# Patient Record
Sex: Female | Born: 1970 | Race: White | Hispanic: No | Marital: Married | Smoking: Never smoker
Health system: Southern US, Community
[De-identification: ages and names within clinical notes are randomized; demographics above are authoritative.]

## PROBLEM LIST (undated history)

## (undated) DIAGNOSIS — E119 Type 2 diabetes mellitus without complications: Secondary | ICD-10-CM

## (undated) DIAGNOSIS — R519 Headache, unspecified: Secondary | ICD-10-CM

## (undated) DIAGNOSIS — G510 Bell's palsy: Secondary | ICD-10-CM

## (undated) DIAGNOSIS — K219 Gastro-esophageal reflux disease without esophagitis: Secondary | ICD-10-CM

## (undated) DIAGNOSIS — L309 Dermatitis, unspecified: Secondary | ICD-10-CM

## (undated) DIAGNOSIS — G43909 Migraine, unspecified, not intractable, without status migrainosus: Secondary | ICD-10-CM

## (undated) HISTORY — DX: Headache, unspecified: R51.9

## (undated) HISTORY — DX: Bell's palsy: G51.0

## (undated) HISTORY — DX: Migraine, unspecified, not intractable, without status migrainosus: G43.909

## (undated) HISTORY — DX: Gastro-esophageal reflux disease without esophagitis: K21.9

## (undated) HISTORY — DX: Type 2 diabetes mellitus without complications: E11.9

## (undated) HISTORY — PX: CHOLECYSTECTOMY: SHX55

## (undated) HISTORY — DX: Dermatitis, unspecified: L30.9

## (undated) HISTORY — PX: LAPAROSCOPIC ENDOMETRIOSIS FULGURATION: SUR769

---

## 2013-09-01 ENCOUNTER — Telehealth: Payer: Self-pay | Admitting: Family Medicine

## 2013-09-01 NOTE — Telephone Encounter (Signed)
Pt is new to the area and needs a PCP Has been seen recently for a WC injury and given prednisone which caused some stomach pain Informed pt that she should follow up with the provider she saw for [facility] for this issue Pt verbalizes understanding but wanted to establish care with [place]

## 2013-09-03 ENCOUNTER — Ambulatory Visit (INDEPENDENT_AMBULATORY_CARE_PROVIDER_SITE_OTHER): Admitting: Family

## 2013-09-03 ENCOUNTER — Encounter (INDEPENDENT_AMBULATORY_CARE_PROVIDER_SITE_OTHER): Payer: Self-pay

## 2013-09-03 ENCOUNTER — Encounter: Payer: Self-pay | Admitting: Family

## 2013-09-03 VITALS — BP 108/69 | HR 64 | Temp 97.7°F | Ht 65.0 in | Wt 202.0 lb

## 2013-09-03 DIAGNOSIS — Z0000 Encounter for general adult medical examination without abnormal findings: Secondary | ICD-10-CM

## 2013-09-03 DIAGNOSIS — K2100 Gastro-esophageal reflux disease with esophagitis, without bleeding: Secondary | ICD-10-CM

## 2013-09-03 DIAGNOSIS — K219 Gastro-esophageal reflux disease without esophagitis: Secondary | ICD-10-CM | POA: Insufficient documentation

## 2013-09-03 LAB — POCT CBC
GRANULOCYTE PERCENT: 62.9 % (ref 37–80)
HCT, POC: 37.3 % — AB (ref 37.7–47.9)
HEMOGLOBIN: 11.8 g/dL — AB (ref 12.2–16.2)
Lymph, poc: 1.7 (ref 0.6–3.4)
MCH: 26.7 pg — AB (ref 27–31.2)
MCHC: 31.7 g/dL — AB (ref 31.8–35.4)
MCV: 84.2 fL (ref 80–97)
MPV: 8 fL (ref 0–99.8)
POC Granulocyte: 3.5 (ref 2–6.9)
POC LYMPH %: 31.7 % (ref 10–50)
Platelet Count, POC: 300 10*3/uL (ref 142–424)
RBC: 4.4 M/uL (ref 4.04–5.48)
RDW, POC: 14.9 %
WBC: 5.5 10*3/uL (ref 4.6–10.2)

## 2013-09-03 NOTE — Progress Notes (Signed)
   Subjective:    Patient ID: Rebecca Hernandez, female    DOB: February 19, 1970, 43 y.o.   MRN: [id number]  Gastrophageal Reflux She complains of heartburn. She reports no coughing or no nausea. This is a new problem. The current episode started 1 to 4 weeks ago. The problem occurs occasionally. The problem has been waxing and waning. The heartburn does not wake her from sleep. The symptoms are aggravated by certain foods and medications. Pertinent negatives include no muscle weakness or orthopnea. She has tried a PPI (Pt to start taking them today) for the symptoms.   *Pt states she was prescribed a steroid dose pack last week. After taking them, pt states she had a GI bleed and was put on omeprazole 40 mg. Pt states she is starting that today. She is have slight epigastric burning pain.    Review of Systems  Constitutional: Negative.   HENT: Negative.   Eyes: Negative.   Respiratory: Negative.  Negative for cough and shortness of breath.   Cardiovascular: Negative.  Negative for palpitations.  Gastrointestinal: Positive for heartburn. Negative for nausea.  Endocrine: Negative.   Genitourinary: Negative.   Musculoskeletal: Negative.  Negative for muscle weakness.  Neurological: Negative.  Negative for headaches.  Hematological: Negative.   Psychiatric/Behavioral: Negative.   All other systems reviewed and are negative.      Objective:   Physical Exam  Vitals reviewed. Constitutional: She is oriented to person, place, and time. She appears well-developed and well-nourished. No distress.  HENT:  Head: Normocephalic and atraumatic.  Right Ear: External ear normal.  Left Ear: External ear normal.  Nose: Nose normal.  Mouth/Throat: Oropharynx is clear and moist.  Eyes: Pupils are equal, round, and reactive to light.  Neck: Normal range of motion. Neck supple. No thyromegaly present.  Cardiovascular: Normal rate, regular rhythm, normal heart sounds and intact distal pulses.   No murmur  heard. Pulmonary/Chest: Effort normal and breath sounds normal. No respiratory distress. She has no wheezes.  Abdominal: Soft. Bowel sounds are normal. She exhibits no distension. There is no tenderness.  Musculoskeletal: Normal range of motion. She exhibits no edema and no tenderness.  Neurological: She is alert and oriented to person, place, and time. She has normal reflexes. No cranial nerve deficit.  Skin: Skin is warm and dry.  Psychiatric: She has a normal mood and affect. Her behavior is normal. Judgment and thought content normal.    BP 108/69  Pulse 64  Temp(Src) 97.7 F (36.5 C) (Oral)  Ht $R'5\' 5"'Gx$  (1.651 m)  Wt 202 lb (91.627 kg)  BMI 33.61 kg/m2  LMP 08/10/2013       Assessment & Plan:  1. Gastroesophageal reflux disease with esophagitis - CMP14+EGFR - POCT CBC  2. Annual physical exam - CMP14+EGFR - Lipid panel - Thyroid Panel With TSH - Vit D  25 hydroxy (rtn osteoporosis monitoring) - POCT CBC   Continue all meds Labs pending Health Maintenance reviewed-Pt to schedule mammogram and pap smear Diet and exercise encouraged RTO 1 year  Evelina Dun, FNP

## 2013-09-03 NOTE — Patient Instructions (Signed)

## 2013-09-04 LAB — CMP14+EGFR
ALK PHOS: 88 IU/L (ref 39–117)
ALT: 16 IU/L (ref 0–32)
AST: 13 IU/L (ref 0–40)
Albumin/Globulin Ratio: 1.7 (ref 1.1–2.5)
Albumin: 4.3 g/dL (ref 3.5–5.5)
BUN / CREAT RATIO: 15 (ref 9–23)
BUN: 9 mg/dL (ref 6–24)
CO2: 24 mmol/L (ref 18–29)
CREATININE: 0.6 mg/dL (ref 0.57–1.00)
Calcium: 9 mg/dL (ref 8.7–10.2)
Chloride: 101 mmol/L (ref 97–108)
GFR calc Af Amer: 130 mL/min/{1.73_m2} (ref >59)
GFR, EST NON AFRICAN AMERICAN: 113 mL/min/{1.73_m2} (ref >59)
GLOBULIN, TOTAL: 2.5 g/dL (ref 1.5–4.5)
Glucose: 112 mg/dL — ABNORMAL HIGH (ref 65–99)
Potassium: 4.4 mmol/L (ref 3.5–5.2)
SODIUM: 139 mmol/L (ref 134–144)
Total Bilirubin: 0.3 mg/dL (ref 0.0–1.2)
Total Protein: 6.8 g/dL (ref 6.0–8.5)

## 2013-09-04 LAB — THYROID PANEL WITH TSH
FREE THYROXINE INDEX: 1.5 (ref 1.2–4.9)
T3 Uptake Ratio: 29 % (ref 24–39)
T4, Total: 5.1 ug/dL (ref 4.5–12.0)
TSH: 0.963 u[IU]/mL (ref 0.450–4.500)

## 2013-09-04 LAB — VITAMIN D 25 HYDROXY (VIT D DEFICIENCY, FRACTURES): Vit D, 25-Hydroxy: 24.9 ng/mL — ABNORMAL LOW (ref 30.0–100.0)

## 2013-09-04 LAB — LIPID PANEL
CHOL/HDL RATIO: 3.4 ratio (ref 0.0–4.4)
Cholesterol, Total: 162 mg/dL (ref 100–199)
HDL: 48 mg/dL (ref >39)
LDL CALC: 103 mg/dL — AB (ref 0–99)
Triglycerides: 57 mg/dL (ref 0–149)
VLDL Cholesterol Cal: 11 mg/dL (ref 5–40)

## 2013-09-05 ENCOUNTER — Other Ambulatory Visit: Payer: Self-pay | Admitting: Family

## 2013-09-05 MED ORDER — VITAMIN D (ERGOCALCIFEROL) 1.25 MG (50000 UNIT) PO CAPS: 50000.0000 [IU] | ORAL_CAPSULE | ORAL | Status: DC

## 2013-09-05 MED ORDER — SIMVASTATIN 20 MG PO TABS: 20.0000 mg | ORAL_TABLET | Freq: Every day | ORAL | Status: DC

## 2014-10-16 ENCOUNTER — Encounter: Payer: Self-pay | Admitting: Allergy and Immunology

## 2014-10-16 ENCOUNTER — Ambulatory Visit (INDEPENDENT_AMBULATORY_CARE_PROVIDER_SITE_OTHER): Admitting: Allergy and Immunology

## 2014-10-16 ENCOUNTER — Other Ambulatory Visit: Payer: Self-pay | Admitting: Allergy and Immunology

## 2014-10-16 VITALS — BP 112/56 | HR 76 | Temp 98.4°F | Resp 16 | Ht 65.0 in | Wt 186.0 lb

## 2014-10-16 DIAGNOSIS — R21 Rash and other nonspecific skin eruption: Secondary | ICD-10-CM

## 2014-10-16 DIAGNOSIS — J309 Allergic rhinitis, unspecified: Secondary | ICD-10-CM | POA: Diagnosis not present

## 2014-10-16 DIAGNOSIS — R059 Cough, unspecified: Secondary | ICD-10-CM

## 2014-10-16 DIAGNOSIS — T7840XA Allergy, unspecified, initial encounter: Secondary | ICD-10-CM

## 2014-10-16 DIAGNOSIS — J3089 Other allergic rhinitis: Secondary | ICD-10-CM

## 2014-10-16 DIAGNOSIS — R05 Cough: Secondary | ICD-10-CM

## 2014-10-16 MED ORDER — EPINEPHRINE 0.3 MG/0.3ML IJ SOAJ: 0.3000 mg | INTRAMUSCULAR | Status: DC | PRN

## 2014-10-16 NOTE — Patient Instructions (Addendum)
Take Home Sheet  1. Avoidance: Mite   --100% avoidance of beef, pork, lamb, deer as discussed.  --Information on alpha-gal.  2. Antihistamine: Zyrtec 10mg  by mouth once daily for runny nose or itching.  3.  Moisturize skin 2-4 times daily with Vanicream/Cetaphil or Aveeno.  4.  Triamcinolone 0.1% cream as needed to rash at back/shoulders as needed twice daily.  5. Nasal Spray: Saline 2 spray(s) each nostril as needed daily for stuffy nose or drainage.   6. Epi-pen/Benadryl as needed.  7. Labs at [place].  8. Follow up Visit:  2 months or sooner if needed.    Websites that have reliable Patient information: 1. American Academy of Asthma, Allergy, & Immunology: www.aaaai.org 2. Food Allergy Network: www.foodallergy.org 3. Mothers of Asthmatics: www.aanma.org 4. [place]: DiningCalendar.de 5. American College of Allergy, Asthma, & Immunology: https://robertson.info/ or www.acaai.org

## 2014-10-19 LAB — CP570 SHELLFISH PANEL
Oyster: <0.1 kU/L
Scallop IgE: <0.1 kU/L

## 2014-10-19 LAB — ALLERGEN, TOMATO F25

## 2014-10-19 LAB — ALLERGEN MILK

## 2014-10-19 LAB — ALLERGEN, PINEAPPLE, F210: Allergen, Pineapple, f210: <0.1 kU/L

## 2014-10-19 LAB — ALLERGEN, ONION, F48: Allergen, Onion, f48: <0.1 kU/L

## 2014-10-19 LAB — ALLERGEN, GARLIC, F47

## 2014-10-19 LAB — ALLERGEN,GRN PEPPER,PAPRIKA,F218

## 2014-10-21 LAB — ALPHA-GAL PANEL
Allergen, Mutton, f88: <0.1 kU/L
Allergen, Pork, f26: <0.1 kU/L
Beef: <0.1 kU/L
Galactose-alpha-1,3-galactose IgE: <0.1 kU/L (ref <0.35)

## 2014-10-21 NOTE — Progress Notes (Signed)
Dear Rebecca Hernandez:  I had the pleasure of seeing Rebecca Hernandez in initial evaluation today.  As you recall she is 44 y.o. who presents with recurring upper respiratory symptoms and concern for food allergy.  After review of her history, examination and testing my impression and recommendations are:  Assessment: 1.  Allergic rhinoconjunctivitis with perennial hypersensitivities. 2.  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 3.  Intermittent cough associated with above. 4.  Patient concern for onion, dairy and pepper allergy. 5.  Dermatitis with significant xerosis and intermittent pruritis. 6.  Keratosis Pilaris.  Plan: 1. Avoidance: Mite   --100% avoidance of beef, pork, lamb, deer as discussed.  --Information on alpha-gal.  2. Antihistamine: Zyrtec 10mg  by mouth once daily for runny nose or itching.  3.  Moisturize skin 2-4 times daily with Vanicream/Cetaphil or Aveeno.  4.  Triamcinolone 0.1% cream as needed to rash at back/shoulders as needed twice daily.  5. Nasal Spray: Saline 2 spray(s) each nostril as needed daily for stuffy nose or drainage.   6. Epi-pen/Benadryl as needed.  7. Labs at [place].  8. Follow up Visit:  2 months or sooner if needed.    History of present illness:  Rebecca Hernandez presents with a year history of recurring rhinorrhea, congestion, sneezing, itchy watery eyes, postnasal drip, occasionally associated cough without seasonal variance.  She notices more cough symptoms when her throat is dry and  occasionally associated with nonspecific itching. In the last 6 months as often as once a week, her symptoms seem greater when associated with other acute symptoms hours after meals.  She reports increasing itching, tingling cough, phlegm production, loose stools and at least one occasion of vomiting.  There may have also been a tingly sensation of her face, throat and greater itching of her hands, palms and ankles.  She wondered if there is swelling of her hands, but  unsure of other specific trigger until the last several weeks when she decided to decrease beef and pork exposures.  Her symptoms are better but not 100% resolved and therefore she is also been concerned about onion, dairy and green pepper exposure.  She denies any lip, tongue or throat swelling, difficulty in breathing or shortness of breath acutely.  She does recall a tick bite approximately year ago, having lived in [place] for about 18 months from [place].  She also recalls, itchy hands with latex, but no difficulty tolerating NSAIDs, jewelry, cosmetics or recollection of sensitivity to bee stings.  She had always thought of herself as having an "iron stomach" having lived in multiple places with [place] association. Patient reports no difficulty with Prednisone.  Today she notes mild itching but otherwise feels well.    Review of systems: Review of Systems  Constitutional: Negative for fever, weight loss and diaphoresis.  HENT: Positive for congestion. Negative for ear pain, hearing loss and sore throat.   Eyes: Negative for pain, discharge and redness.  Respiratory: Positive for cough and wheezing. Negative for sputum production and shortness of breath.   Cardiovascular: Negative for chest pain and palpitations.  Gastrointestinal: Negative for nausea, abdominal pain, constipation and blood in stool.  Genitourinary: Negative.   Musculoskeletal: Negative.   Skin: Positive for itching and rash.  Neurological: Positive for headaches. Negative for dizziness, tremors and weakness.    Past medical history: Past Medical History  Diagnosis Date  . GERD (gastroesophageal reflux disease)   . Eczema     Past surgical history: Past Surgical History  Procedure Laterality Date  .  Laparoscopic endometriosis fulguration    . Galllbladder N/A last year sept. 2015    Family history: Family History  Problem Relation Age of Onset  . Allergic rhinitis Neg Hx   . Angioedema Neg Hx   .  Asthma Neg Hx   . Atopy Neg Hx   . Eczema Neg Hx   . Immunodeficiency Neg Hx   . Urticaria Neg Hx     Social history:  Social History   Social History  . Marital Status: Married    Spouse Name: N/A  . Number of Children: N/A  . Years of Education: N/A   Occupational History  . Not on file.   Social History Main Topics  . Smoking status: Never Smoker   . Smokeless tobacco: Not on file  . Alcohol Use: 0.6 oz/week    1 Glasses of wine per week     Comment: rare  . Drug Use: No  . Sexual Activity: Not on file   Other Topics Concern  . Not on file   Social History Narrative    Environmental History: Pets in the home: dogs (1) and cats (1). Flooring: wall-to-wall carpeting Climate Control: air filters, central or room air conditioning and forced hot air heat Dust Mite Controls: Dust mite controls are not in place. Tobacco Smoke in Home: no   Known medication allergies: Allergies  Allergen Reactions  . Dilaudid [Hydromorphone Hcl] Shortness Of Breath    Disoriented. Burning sensation constant.    Outpatient medications:   Medication List       This list is accurate as of: 10/16/14 11:59 PM.  Always use your most recent med list.               EPINEPHrine 0.3 mg/0.3 mL Soaj injection  Commonly known as:  EPI-PEN  Inject 0.3 mLs (0.3 mg total) into the muscle as needed.     omeprazole 40 MG capsule  Commonly known as:  PRILOSEC  Take 40 mg by mouth daily.     simvastatin 20 MG tablet  Commonly known as:  ZOCOR  Take 1 tablet (20 mg total) by mouth at bedtime.     Vitamin D (Ergocalciferol) 50000 UNITS Caps capsule  Commonly known as:  DRISDOL  Take 1 capsule (50,000 Units total) by mouth every 7 (seven) days.        Physical examination: Blood pressure 112/56, pulse 76, temperature 98.4 F (36.9 C), resp. rate 16, height 5\' 5"  (1.651 m), weight 186 lb (84.369 kg).  Physical Exam  Constitutional: She appears well-nourished.  HENT:  Right Ear:  Tympanic membrane and ear canal normal.  Left Ear: Tympanic membrane and ear canal normal.  Nose: Mucosal edema present.  Mouth/Throat: Uvula is midline and oropharynx is clear and moist.  Neck: Neck supple.  Cardiovascular: Normal rate, S1 normal and S2 normal.   Pulmonary/Chest: Effort normal and breath sounds normal. She has no wheezes. She has no rhonchi. She has no rales.  Abdominal: Soft. Normal appearance and bowel sounds are normal.  Lymphadenopathy:    She has no cervical adenopathy.  Neurological: She is alert.  Skin: Skin is dry. Rash noted. Rash is maculopapular. No cyanosis. Nails show no clubbing.    Diagnostics:  Spirometry=FVC  3.24--95%; FEV1 2.71--96%.  Percutaneous skin testing=Mild reactivity to dust mite and cat hair; otherwise negative including selected foods.    Kay[place]arie will make dietary modifications as discussed, obtain labs and follow-up as discussed.   Please contact me at your earliest  convenience with any questions or concerns.  Sincerely,    Roselyn M. Ishmael Holter, MD

## 2014-12-03 ENCOUNTER — Encounter: Payer: Self-pay | Admitting: Gastroenterology

## 2014-12-16 ENCOUNTER — Ambulatory Visit: Admitting: Allergy and Immunology

## 2014-12-24 ENCOUNTER — Ambulatory Visit: Payer: Self-pay | Admitting: Gastroenterology

## 2015-01-13 ENCOUNTER — Ambulatory Visit: Payer: Self-pay | Admitting: Gastroenterology

## 2015-01-13 ENCOUNTER — Encounter: Payer: Self-pay | Admitting: Gastroenterology

## 2015-05-26 ENCOUNTER — Encounter: Payer: Self-pay | Admitting: Gastroenterology

## 2015-06-11 ENCOUNTER — Ambulatory Visit: Payer: Self-pay | Admitting: Gastroenterology

## 2015-06-30 ENCOUNTER — Ambulatory Visit: Payer: Self-pay | Admitting: Gastroenterology

## 2015-07-01 ENCOUNTER — Encounter: Payer: Self-pay | Admitting: Gastroenterology

## 2015-07-01 ENCOUNTER — Ambulatory Visit (INDEPENDENT_AMBULATORY_CARE_PROVIDER_SITE_OTHER): Admitting: Gastroenterology

## 2015-07-01 VITALS — BP 110/60 | HR 72 | Temp 97.7°F | Ht 66.0 in | Wt 198.6 lb

## 2015-07-01 DIAGNOSIS — R109 Unspecified abdominal pain: Secondary | ICD-10-CM | POA: Diagnosis not present

## 2015-07-01 DIAGNOSIS — R110 Nausea: Secondary | ICD-10-CM | POA: Diagnosis not present

## 2015-07-01 DIAGNOSIS — R197 Diarrhea, unspecified: Secondary | ICD-10-CM | POA: Diagnosis not present

## 2015-07-01 DIAGNOSIS — R894 Abnormal immunological findings in specimens from other organs, systems and tissues: Secondary | ICD-10-CM | POA: Insufficient documentation

## 2015-07-01 NOTE — Progress Notes (Signed)
cc'ed to pcp °

## 2015-07-01 NOTE — Progress Notes (Signed)
Primary Care Physician:  Wende Neighbors, MD  Primary Gastroenterologist:  Barney Drain, MD   Chief Complaint  Patient presents with  . OTHER    stomach pains/ nausea    HPI:  Rebecca Hernandez is a 45 y.o. female here for further evaluation of diarrhea, abdominal cramping, nausea at the request of PCP. She states last year back in December she had labs which indicated positive celiac screen. She was supposed to come see [place] earlier but had to reschedule multiple times related to her job. She complains of postprandial diarrhea which occurs within 5-10 minutes of most meals. She has 5-6 loose stools daily. Denies melena rectal bleeding. Symptoms associated with abdominal cramping. She states in the past she was prone to constipation and if regular never had more than 1 stool daily. Other symptoms include postprandial nausea and coughing. She states it feels like she has a desert climbing up her throat after meals. Denies heartburn but feels like severe dryness in her chest after meals. No associated weight loss. No family history of celiac disease. Has to use Biotene for dry mouth and new symptom of bad breath.  Still eating gluten because she read that she had to be on gluten diet prior to confirmation testing. She has actually made a point to consume adequate gluten daily. Symptoms deftly worse after eating gluten.    No current outpatient prescriptions on file.   No current facility-administered medications for this visit.    Allergies as of 07/01/2015 - Review Complete 07/01/2015  Allergen Reaction Noted  . Dilaudid [hydromorphone hcl] Shortness Of Breath 09/03/2013    Past Medical History  Diagnosis Date  . GERD (gastroesophageal reflux disease)   . Eczema   . Migraine headache     Past Surgical History  Procedure Laterality Date  . Laparoscopic endometriosis fulguration    . Cholecystectomy N/A last year sept. 2015    Family History  Problem Relation Age of Onset  . Allergic  rhinitis Neg Hx   . Angioedema Neg Hx   . Asthma Neg Hx   . Atopy Neg Hx   . Eczema Neg Hx   . Immunodeficiency Neg Hx   . Urticaria Neg Hx   . Celiac disease Neg Hx   . Diabetes Maternal Uncle     Social History   Social History  . Marital Status: Married    Spouse Name: N/A  . Number of Children: 0  . Years of Education: N/A   Occupational History  . Air cabin crew of [employer]    Social History Main Topics  . Smoking status: Never Smoker   . Smokeless tobacco: Not on file     Comment: Never smoked  . Alcohol Use: 0.6 oz/week    1 Glasses of wine per week     Comment: rare  . Drug Use: No  . Sexual Activity: Not on file   Other Topics Concern  . Not on file   Social History Narrative   She is from [place]. Husband is in the [place] [employer]. She moved to the state 6 years ago.      ROS:  General: Negative for anorexia, weight loss, fever, chills, fatigue, weakness. Eyes: Negative for vision changes.  ENT: Negative for hoarseness, difficulty swallowing , nasal congestion. See history of present illness CV: Negative for chest pain, angina, palpitations, dyspnea on exertion, peripheral edema.  Respiratory: Negative for dyspnea at rest, dyspnea on exertion, cough, sputum, wheezing.  GI: See history of present illness. GU:  Negative for dysuria, hematuria, urinary incontinence, urinary frequency, nocturnal urination.  MS: Negative for joint pain, low back pain.  Derm: Negative for rash or itching.  Neuro: Negative for weakness, abnormal sensation, seizure, frequent headaches, memory loss, confusion.  Psych: Negative for anxiety, depression, suicidal ideation, hallucinations.  Endo: Negative for unusual weight change.  Heme: Negative for bruising or bleeding. Allergy: Negative for rash or hives.    Physical Examination:  BP 110/60 mmHg  Pulse 72  Temp(Src) 97.7 F (36.5 C) (Oral)  Ht 5\' 6"  (1.676 m)  Wt 198 lb 9.6 oz (90.084 kg)  BMI 32.07 kg/m2  LMP 06/15/2015  (Exact Date)   General: Well-nourished, well-developed in no acute distress.  Head: Normocephalic, atraumatic.   Eyes: Conjunctiva pink, no icterus. Mouth: Oropharyngeal mucosa moist and pink , no lesions erythema or exudate. Neck: Supple without thyromegaly, masses, or lymphadenopathy.  Lungs: Clear to auscultation bilaterally.  Heart: Regular rate and rhythm, no murmurs rubs or gallops.  Abdomen: Bowel sounds are normal, nontender, nondistended, no hepatosplenomegaly or masses, no abdominal bruits or    hernia , no rebound or guarding.   Rectal: Not performed Extremities: No lower extremity edema. No clubbing or deformities.  Neuro: Alert and oriented x 4 , grossly normal neurologically.  Skin: Warm and dry, no rash or jaundice.   Psych: Alert and cooperative, normal mood and affect.  Labs: Patient had copy of her labs done from November 2016 Sodium 137, potassium 4.2, BUN 11, creatinine 0.66, glucose 110, total bilirubin 0.9, alkaline phosphatase 73, AST 18, ALT 13, albumin 3.9, white blood cell count 6400, hemoglobin 11.6, hematocrit 36.2, platelets 3 and 72,000, sedimentation rate 11, amylase 17, lipase 7, TSH 1.211, IgM 164, allergens, gluten, IgG 18.5 (normal less than 2)  Patient had numerous food allergy testing in September 2006 including shellfish, beef, pork, milk, tomato, garlic, onions, peppers, pineapple all negative.  Imaging Studies: No results found.

## 2015-07-01 NOTE — Assessment & Plan Note (Signed)
45 year old [place] female who presents with greater than 6 month history of postprandial diarrhea, abdominal cramping, nausea. Labs as outlined above. She had elevated gluten, IgG level. Discussed with patient, would like to pursue further celiac serology screen but likely she will need to have an upper endoscopy with small bowel biopsy in the near future. Advised to continue gluten diet for now until testing complete. Patient voiced understanding. Further recommendations to follow pending labs.

## 2015-07-01 NOTE — Patient Instructions (Addendum)
1. Please have your labs done and we will contact you with results when available.

## 2015-07-02 LAB — IGA: IgA: 302 mg/dL (ref 81–463)

## 2015-07-05 LAB — TISSUE TRANSGLUTAMINASE, IGA: TISSUE TRANSGLUTAMINASE AB, IGA: 1 U/mL (ref <4)

## 2015-07-07 ENCOUNTER — Telehealth: Payer: Self-pay | Admitting: Gastroenterology

## 2015-07-07 NOTE — Telephone Encounter (Signed)
PATIENT CALLED INQUIRING ABOUT HER LAB RESULTS FROM LAST WEEK

## 2015-07-07 NOTE — Telephone Encounter (Signed)
Forwarding to Leslie Lewis, PA for results.  

## 2015-07-08 NOTE — Telephone Encounter (Signed)
PT said she will send them now.

## 2015-07-08 NOTE — Telephone Encounter (Signed)
Can you ask the patient to email me the picture of her labs done by Dr. Nevada Crane? She showed them to me in the office. I need to see them again, we have tried twice to get from Dr. Juel Burrow office without luck.

## 2015-07-11 NOTE — Progress Notes (Signed)
Quick Note:  Please let patient know that her TTG, IgA is negative. This test is the appropriate test to SCREEN for celiac disease. Her Allergen Gluten, IgG test that was positive by her PCP may indicated a gluten sensitivity though.   I will discuss with Dr. Oneida Alar. Patient may need TCS/EGD for her diarrhea, and ugi symptoms however.   Dr. Oneida Alar, Do you recommend TCS for diarrhea, change in bowels, and EGD for ugi sx +/- sb bx? ______

## 2015-07-11 NOTE — Telephone Encounter (Signed)
See result note.  

## 2015-07-12 NOTE — Progress Notes (Signed)
Quick Note:  PT is aware of results and we will call her when Dr. Oneida Alar' makes a recommendation. ______

## 2015-07-12 NOTE — Progress Notes (Signed)
REVIEWED. TCS/EGD for change in bowel habits: diarrhea/abdominal pain. TTG IgA sensitivity is not 100%.

## 2015-07-13 NOTE — Progress Notes (Signed)
Quick Note:  Please let patient know [facility] recommends TCS/EGD with small bowel biopsy for change in bowels, diarrhea, ugi symptoms.  Please schedule. ______

## 2015-07-14 NOTE — Progress Notes (Signed)
Quick Note:  PT is aware that Ginger will call her to schedule. ______

## 2015-07-28 ENCOUNTER — Other Ambulatory Visit: Payer: Self-pay

## 2015-07-28 DIAGNOSIS — R197 Diarrhea, unspecified: Secondary | ICD-10-CM

## 2015-07-28 DIAGNOSIS — R198 Other specified symptoms and signs involving the digestive system and abdomen: Secondary | ICD-10-CM

## 2015-07-28 MED ORDER — NA SULFATE-K SULFATE-MG SULF 17.5-3.13-1.6 GM/177ML PO SOLN: 1.0000 | ORAL | Status: DC

## 2015-08-10 ENCOUNTER — Telehealth: Payer: Self-pay

## 2015-08-10 NOTE — Telephone Encounter (Signed)
PLEASE CALL PT. SHE CAN [place] TCS UNTIL SHE IS RECOVERED FROM HER UTI.

## 2015-08-10 NOTE — Telephone Encounter (Signed)
Pt is calling to see if she needs to cancel her TCS for Thursday because she has a kidney infection and is taking anti bx. Please advise

## 2015-08-10 NOTE — Telephone Encounter (Signed)
Tried to call with no answer  

## 2015-08-11 NOTE — Telephone Encounter (Signed)
Pt is aware to reschedule. She will call us when she has finished her medications.

## 2015-08-12 ENCOUNTER — Encounter (HOSPITAL_COMMUNITY): Admission: RE | Payer: Self-pay | Source: Ambulatory Visit

## 2015-08-12 ENCOUNTER — Ambulatory Visit (HOSPITAL_COMMUNITY): Admission: RE | Admit: 2015-08-12 | Source: Ambulatory Visit | Admitting: Gastroenterology

## 2015-08-12 SURGERY — COLONOSCOPY
Anesthesia: Moderate Sedation

## 2016-03-07 ENCOUNTER — Encounter: Payer: Self-pay | Admitting: Gastroenterology

## 2016-03-07 ENCOUNTER — Ambulatory Visit (INDEPENDENT_AMBULATORY_CARE_PROVIDER_SITE_OTHER): Admitting: Gastroenterology

## 2016-03-07 VITALS — BP 113/68 | HR 82 | Temp 98.1°F | Ht 66.0 in | Wt 192.4 lb

## 2016-03-07 DIAGNOSIS — R110 Nausea: Secondary | ICD-10-CM | POA: Diagnosis not present

## 2016-03-07 DIAGNOSIS — R894 Abnormal immunological findings in specimens from other organs, systems and tissues: Secondary | ICD-10-CM | POA: Diagnosis not present

## 2016-03-07 DIAGNOSIS — R197 Diarrhea, unspecified: Secondary | ICD-10-CM

## 2016-03-07 DIAGNOSIS — D649 Anemia, unspecified: Secondary | ICD-10-CM

## 2016-03-07 DIAGNOSIS — R1310 Dysphagia, unspecified: Secondary | ICD-10-CM

## 2016-03-07 DIAGNOSIS — R109 Unspecified abdominal pain: Secondary | ICD-10-CM | POA: Diagnosis not present

## 2016-03-07 NOTE — Progress Notes (Addendum)
REVIEWED-NO ADDITIONAL RECOMMENDATIONS.  Primary Care Physician:  Wende Neighbors, MD  Primary Gastroenterologist:  Barney Drain, MD   Chief Complaint  Patient presents with  . Abdominal Pain    lower abd  . Diarrhea    HPI:  Rebecca Hernandez is a 46 y.o. female here For follow-up. She is seen back in June 2017 as a new patient for change in bowel habits including diarrhea, abdominal cramping, nausea. At that time patient wanted to be evaluated for positive celiac screen done by another provider. We had plans for colonoscopy, EGD with small bowel biopsy but she had to cancel because of a urinary tract infection. She states she forgot to reschedule until her symptoms progressively got worse of the course of the last couple weeks.  Patient states her symptoms all began about 2 years ago. She developed hot flashes that lasted for about 2 days. The following day she had acute onset abdominal pain and required urgent cholecystectomy. Patient states a month after that she had a tick bite started having issues with foods affecting her, postprandial abdominal cramping and diarrhea. Initially was unable to eat red meat without becoming sick. She would have facial numbness/redness, hands would become itchy and developed rash. She was prescribed EpiPen. Over the course of the past couple of years now she is having symptoms with almost everything she eats. She states she would think it was in her head if it weren't for the rash that she develops her hands. She has went through dairy elimination, gluten elimination, predominantly eating low FODMAP but it has gotten to the point that everything causes her abdominal cramping and diarrhea. Unable to tolerate plain rice cakes at this point. She has a lot of abdominal noises/gurgling, gas. Typically stools start out semi-formed and developed to watery diarrhea occurring within minutes after eating. No melena rectal bleeding. Over the last week she's had profuse watery  diarrhea. She describes having a "desert" in her esophagus. Extremely dry, feels uncomfortable when she swallows food or liquid. Not really heartburn. Drinks lots of water. Feels like pooped, and up into her esophagus. No vomiting.  Middle to lower abd pain radiating into her back, more severe in the past couple of weeks. No fever. Blood work with her PCP revealed anemia, hemoglobin 10.8, hematocrit 34. She states her ferritin was low but I do not have that value. Her LFTs were normal. Her hemoglobin A1c was 5.8. These values were provided from her, she had taken a picture of her results.  Her the past 4 months her menstrual cycle has worsened. She is having more heavy bleeding, lasting 2-3 days associated with passage of blood clots. Menstrual cycle every 28 days or so.  Patient saw an allergist Dr. Ishmael Holter back in 2016 for suspected food allergies. There was suspected alpha gal mammalian allergy although lab testing did not support this finding. Other food testing included pineapple, pepper, onion, garlic, tomato, milk, shellfish which were unremarkable. Previously her PCP checked allergen gluten, IgG test that was positive. Her TTG, IgA level was negative.    No current outpatient prescriptions on file.   No current facility-administered medications for this visit.     Allergies as of 03/07/2016 - Review Complete 03/07/2016  Allergen Reaction Noted  . Dilaudid [hydromorphone hcl] Shortness Of Breath 09/03/2013    Past Medical History:  Diagnosis Date  . Eczema   . GERD (gastroesophageal reflux disease)   . Migraine headache     Past Surgical History:  Procedure Laterality Date  .  CHOLECYSTECTOMY N/A last year sept. 2015  . LAPAROSCOPIC ENDOMETRIOSIS FULGURATION      Family History  Problem Relation Age of Onset  . Diabetes Maternal Uncle   . Allergic rhinitis Neg Hx   . Angioedema Neg Hx   . Asthma Neg Hx   . Atopy Neg Hx   . Eczema Neg Hx   . Immunodeficiency Neg Hx   .  Urticaria Neg Hx   . Celiac disease Neg Hx     Social History   Social History  . Marital status: Married    Spouse name: N/A  . Number of children: 0  . Years of education: N/A   Occupational History  . [employer] cabin [employer]    Social History Main Topics  . Smoking status: Never Smoker  . Smokeless tobacco: Never Used     Comment: Never smoked  . Alcohol use 0.6 oz/week    1 Glasses of wine per week     Comment: rare  . Drug use: No  . Sexual activity: Not on file   Other Topics Concern  . Not on file   Social History Narrative   She is from [place]. Husband is in the [employer]. She moved to the state 6 years ago.      ROS:  General: Negative for anorexia, weight loss, fever, chills. Positive fatigue, weakness. Eyes: Negative for vision changes.  ENT: Negative for hoarseness, difficulty swallowing , nasal congestion. CV: Negative for chest pain, angina, palpitations, dyspnea on exertion, peripheral edema.  Respiratory: Negative for dyspnea at rest, dyspnea on exertion, cough, sputum, wheezing.  GI: See history of present illness. GU:  Negative for dysuria, hematuria, urinary incontinence, urinary frequency, nocturnal urination. See history of present illness MS: Negative for joint pain, low back pain.  Derm: Negative for rash or itching.  Neuro: Negative for weakness, abnormal sensation, seizure, frequent headaches, memory loss, confusion.  Psych: Negative for anxiety, depression, suicidal ideation, hallucinations.  Endo: Negative for unusual weight change.  Heme: Negative for bruising or bleeding. Allergy: Negative for rash or hives.    Physical Examination:  BP 113/68   Pulse 82   Temp 98.1 F (36.7 C) (Oral)   Ht 5\' 6"  (1.676 m)   Wt 192 lb 6.4 oz (87.3 kg)   LMP 03/06/2016 (Exact Date)   BMI 31.05 kg/m    General: Well-nourished, well-developed in no acute distress. Pale. Head: Normocephalic, atraumatic.   Eyes: Conjunctiva pale no  icterus. Mouth: Oropharyngeal mucosa moist and pink , no lesions erythema or exudate. Neck: Supple without thyromegaly, masses, or lymphadenopathy.  Lungs: Clear to auscultation bilaterally.  Heart: Regular rate and rhythm, no murmurs rubs or gallops.  Abdomen: Bowel sounds are normal, periumbilical moderate tenderness, nondistended, no hepatosplenomegaly or masses, no abdominal bruits or    hernia , no rebound or guarding.   Rectal: Not performed Extremities: No lower extremity edema. No clubbing or deformities.  Neuro: Alert and oriented x 4 , grossly normal neurologically.  Skin: Warm and dry, no rash or jaundice.   Psych: Alert and cooperative, normal mood and affect.  Imaging Studies: No results found.

## 2016-03-07 NOTE — Patient Instructions (Signed)
1. Please collect stool specimen to rule out infection. 2. Colonoscopy and upper endoscopy as scheduled. Please see separate instructions.

## 2016-03-07 NOTE — Assessment & Plan Note (Signed)
46 year old [place] female presenting with a 2 year history of postprandial abdominal cramping, nausea, diarrhea. Multiple allergy testing previously by Dr. Ishmael Holter. She has a history of allergen gluten, IgG test that was positive done by her PCP. She has practically eliminated all gluten at this point. Tolerating no foods at this point. Significant diarrhea. Last week may have had acute on chronic diarrhea related to superimposed gastroenteritis. Her weight is down about 6 pounds from last year. Patient also has some upper GI symptoms including severe sensation of dryness in her esophagus with uncomfortable sensation with swallowing. Now with aggressive anemia but also in the setting of significant menstrual losses.  Patient needs evaluation, rule out IBD, microscopic colitis, malignancy, celiac disease, eosinophilic gastroenteritis/esophagitis. Plan on EGD with biopsy/small bowel biopsies, colonoscopy with appropriate biopsies in the near future.  I have discussed the risks, alternatives, benefits with regards to but not limited to the risk of reaction to medication, bleeding, infection, perforation and the patient is agreeable to proceed. Written consent to be obtained.  Will check GI pathogen panel. Obtain recent labs from PCP. Requested patient to add some gluten to her diet between now and her procedures.

## 2016-03-07 NOTE — Progress Notes (Signed)
CC'ED TO PCP 

## 2016-03-08 ENCOUNTER — Other Ambulatory Visit: Payer: Self-pay

## 2016-03-08 ENCOUNTER — Telehealth: Payer: Self-pay

## 2016-03-08 DIAGNOSIS — R197 Diarrhea, unspecified: Secondary | ICD-10-CM

## 2016-03-08 DIAGNOSIS — R1310 Dysphagia, unspecified: Secondary | ICD-10-CM

## 2016-03-08 DIAGNOSIS — D649 Anemia, unspecified: Secondary | ICD-10-CM

## 2016-03-08 DIAGNOSIS — R109 Unspecified abdominal pain: Secondary | ICD-10-CM

## 2016-03-08 DIAGNOSIS — R894 Abnormal immunological findings in specimens from other organs, systems and tissues: Secondary | ICD-10-CM

## 2016-03-08 MED ORDER — SOD PICOSULFATE-MAG OX-CIT ACD 10-3.5-12 MG-GM-GM PO PACK: 1.0000 | PACK | ORAL | 0 refills | Status: DC

## 2016-03-08 NOTE — Telephone Encounter (Signed)
Called pt to schedule TCS/EGD with SB biopsy with [facility]. Procedure scheduled for 03/27/16 at 8:30am. Rx for prep sent to pharmacy. Instructions mailed.

## 2016-03-27 ENCOUNTER — Encounter (HOSPITAL_COMMUNITY)
Admission: RE | Disposition: A | Discharge status: Home / Self Care | Payer: Self-pay | Source: Ambulatory Visit | Attending: Gastroenterology

## 2016-03-27 ENCOUNTER — Ambulatory Visit (HOSPITAL_COMMUNITY)
Admission: RE | Admit: 2016-03-27 | Discharge: 2016-03-27 | Disposition: A | Discharge status: Home / Self Care | Source: Ambulatory Visit | Attending: Gastroenterology | Admitting: Gastroenterology

## 2016-03-27 ENCOUNTER — Encounter (HOSPITAL_COMMUNITY): Payer: Self-pay | Admitting: *Deleted

## 2016-03-27 DIAGNOSIS — D649 Anemia, unspecified: Secondary | ICD-10-CM | POA: Diagnosis not present

## 2016-03-27 DIAGNOSIS — K648 Other hemorrhoids: Secondary | ICD-10-CM | POA: Insufficient documentation

## 2016-03-27 DIAGNOSIS — R109 Unspecified abdominal pain: Secondary | ICD-10-CM | POA: Insufficient documentation

## 2016-03-27 DIAGNOSIS — B9681 Helicobacter pylori [H. pylori] as the cause of diseases classified elsewhere: Secondary | ICD-10-CM | POA: Diagnosis not present

## 2016-03-27 DIAGNOSIS — Q438 Other specified congenital malformations of intestine: Secondary | ICD-10-CM | POA: Insufficient documentation

## 2016-03-27 DIAGNOSIS — R1013 Epigastric pain: Secondary | ICD-10-CM

## 2016-03-27 DIAGNOSIS — K2970 Gastritis, unspecified, without bleeding: Secondary | ICD-10-CM | POA: Diagnosis not present

## 2016-03-27 DIAGNOSIS — K644 Residual hemorrhoidal skin tags: Secondary | ICD-10-CM | POA: Insufficient documentation

## 2016-03-27 DIAGNOSIS — R197 Diarrhea, unspecified: Secondary | ICD-10-CM

## 2016-03-27 DIAGNOSIS — R1310 Dysphagia, unspecified: Secondary | ICD-10-CM

## 2016-03-27 DIAGNOSIS — R894 Abnormal immunological findings in specimens from other organs, systems and tissues: Secondary | ICD-10-CM

## 2016-03-27 HISTORY — PX: BIOPSY: SHX5522

## 2016-03-27 HISTORY — PX: COLONOSCOPY: SHX5424

## 2016-03-27 HISTORY — PX: ESOPHAGOGASTRODUODENOSCOPY: SHX5428

## 2016-03-27 SURGERY — COLONOSCOPY
Anesthesia: Moderate Sedation

## 2016-03-27 MED ORDER — STERILE WATER FOR IRRIGATION IR SOLN
Status: DC | PRN
  Administered 2016-03-27: 09:00:00

## 2016-03-27 MED ORDER — MEPERIDINE HCL 100 MG/ML IJ SOLN
INTRAMUSCULAR | Status: DC | PRN
  Administered 2016-03-27: 25 mg via INTRAVENOUS
  Administered 2016-03-27: 50 mg via INTRAVENOUS
  Administered 2016-03-27: 25 mg via INTRAVENOUS

## 2016-03-27 MED ORDER — SIMETHICONE 40 MG/0.6ML PO SUSP
ORAL | Status: AC
  Filled 2016-03-27: qty 30

## 2016-03-27 MED ORDER — LIDOCAINE VISCOUS 2 % MT SOLN
OROMUCOSAL | Status: AC
  Filled 2016-03-27: qty 15

## 2016-03-27 MED ORDER — LIDOCAINE VISCOUS 2 % MT SOLN
OROMUCOSAL | Status: DC | PRN
  Administered 2016-03-27: 1 via OROMUCOSAL

## 2016-03-27 MED ORDER — MIDAZOLAM HCL 5 MG/5ML IJ SOLN
INTRAMUSCULAR | Status: DC | PRN
  Administered 2016-03-27 (×3): 2 mg via INTRAVENOUS
  Administered 2016-03-27: 1 mg via INTRAVENOUS

## 2016-03-27 MED ORDER — MIDAZOLAM HCL 5 MG/5ML IJ SOLN
INTRAMUSCULAR | Status: AC
  Filled 2016-03-27: qty 10

## 2016-03-27 MED ORDER — MEPERIDINE HCL 100 MG/ML IJ SOLN
INTRAMUSCULAR | Status: AC
  Filled 2016-03-27: qty 2

## 2016-03-27 MED ORDER — SODIUM CHLORIDE 0.9 % IV SOLN
INTRAVENOUS | Status: DC
  Administered 2016-03-27: 08:00:00 via INTRAVENOUS

## 2016-03-27 NOTE — Op Note (Signed)
[facility] Patient Name: Rebecca Hernandez Procedure Date: 03/27/2016 8:40 AM MRN: [id number] Date of Birth: 06-15-1970 Attending MD: Barney Drain , MD CSN: [id number] Age: 46 Admit Type: Outpatient Procedure:                Colonoscopy WITH COLD FORCES BIOPSY Indications:              Clinically significant diarrhea of unexplained                            origin, Anemia Providers:                Barney Drain, MD, Janeece Riggers, RN, [place]. [place],                            Technician Referring MD:             Delphina Cahill, MD Medicines:                Meperidine 100 mg IV, Midazolam 5 mg IV Complications:            No immediate complications. Estimated Blood Loss:     Estimated blood loss was minimal. Procedure:                Pre-Anesthesia Assessment:                           - Prior to the procedure, a History and Physical                            was performed, and patient medications and                            allergies were reviewed. The patient's tolerance of                            previous anesthesia was also reviewed. The risks                            and benefits of the procedure and the sedation                            options and risks were discussed with the patient.                            All questions were answered, and informed consent                            was obtained. Prior Anticoagulants: The patient has                            taken no previous anticoagulant or antiplatelet                            agents. ASA Grade Assessment: I - A normal, healthy  patient. After reviewing the risks and benefits,                            the patient was deemed in satisfactory condition to                            undergo the procedure. After obtaining informed                            consent, the colonoscope was passed under direct                            vision. Throughout the procedure, the patient's                             blood pressure, pulse, and oxygen saturations were                            monitored continuously. The EC-[id number] ([id number])                            scope was introduced through the anus and advanced                            to the 10 cm into the ileum. The colonoscopy was                            performed without difficulty. The patient tolerated                            the procedure well. The quality of the bowel                            preparation was good. The terminal ileum, ileocecal                            valve, appendiceal orifice, and rectum were                            photographed. Scope In: 9:00:45 AM Scope Out: 9:16:53 AM Scope Withdrawal Time: 0 hours 13 minutes 36 seconds  Total Procedure Duration: 0 hours 16 minutes 8 seconds  Findings:      The terminal ileum appeared normal.      The recto-sigmoid colon and sigmoid colon were mildly redundant.       Biopsies were obtained from the proximal and distal esophagus with cold       forceps for histology of suspected eosinophilic esophagitis. Biopsies       for histology were taken with a cold forceps from the cecum, ascending       colon, transverse colon and descending colon for evaluation of       microscopic colitis.      External hemorrhoids were found during retroflexion. The hemorrhoids       were moderate.      Internal hemorrhoids were found during  retroflexion. The hemorrhoids       were small. Impression:               - NO SOURCE FOR DIARRHEA/ANEMIA/ABDOMINAL PAIN                            IDENTIFIED                           - Redundant colon.                           - External hemorrhoids.                           - Internal hemorrhoids. Moderate Sedation:      Moderate (conscious) sedation was administered by the endoscopy nurse       and supervised by the endoscopist. The following parameters were       monitored: oxygen saturation, heart rate, blood  pressure, and response       to care. Total physician intraservice time was 44 minutes. Recommendation:           - Repeat colonoscopy in 10 years for surveillance.                           - Low fat diet and lactose free diet.                           - Continue present medications.                           - Await pathology results.                           - Return to my office in 3 months.                           - Patient has a contact number available for                            emergencies. The signs and symptoms of potential                            delayed complications were discussed with the                            patient. Return to normal activities tomorrow.                            Written discharge instructions were provided to the                            patient. Procedure Code(s):        --- Professional ---                           [phone], Colonoscopy, flexible; with biopsy, single  or multiple                           99152, Moderate sedation services provided by the                            same physician or other qualified health care                            professional performing the diagnostic or                            therapeutic service that the sedation supports,                            requiring the presence of an independent trained                            observer to assist in the monitoring of the                            patient's level of consciousness and physiological                            status; initial 15 minutes of intraservice time,                            patient age 35 years or older                           [phone], Moderate sedation services; each additional                            15 minutes intraservice time                           99153, Moderate sedation services; each additional                            15 minutes intraservice time Diagnosis Code(s):        ---  Professional ---                           K64.4, Residual hemorrhoidal skin tags                           K64.8, Other hemorrhoids                           R19.7, Diarrhea, unspecified                           D64.9, Anemia, unspecified                           Q43.8, Other specified congenital malformations of  intestine CPT copyright 2016 [employer]. All rights reserved. The codes documented in this report are preliminary and upon coder review may  be revised to meet current compliance requirements. Barney Drain, MD Barney Drain, MD 03/27/2016 9:51:48 AM This report has been signed electronically. Number of Addenda: 0

## 2016-03-27 NOTE — Discharge Instructions (Signed)
NO SOURCE FOR YOUR DIARRHEA/ABDOMINAL PAIN/ANEMIA WAS IDENTIFIED. YOU DID NOT HAVE ANY POLYPS. You have internal hemorrhoids. YOU HAVE MILD gastritis.THE LAST PART OF YOUR SMALL BOWEL IS NORMAL.  I biopsied your stomach, SMALL BOWEL, & colon.    AVOID FOODS THAT CAUSE YOUR SYMPTOMS TO FLARE.  FOLLOW A LOW FAT/DAIRY FREE DIET. AVOID ITEMS THAT CAUSE BLOATING. SEE INFO BELOW.  YOUR BIOPSY RESULTS WILL BE AVAILABLE IN MY CHART AFTER MAR 16 AND MY OFFICE WILL CONTACT YOU IN 10-14 DAYS WITH YOUR RESULTS.   FOLLOW UP IN 3 MOS.  Next colonoscopy IN 10 YEARS.   ENDOSCOPY Care After Read the instructions outlined below and refer to this sheet in the next week. These discharge instructions provide you with general information on caring for yourself after you leave the hospital. While your treatment has been planned according to the most current medical practices available, unavoidable complications occasionally occur. If you have any problems or questions after discharge, call DR. Junette Bernat, [phone].  ACTIVITY  You may resume your regular activity, but move at a slower pace for the next 24 hours.   Take frequent rest periods for the next 24 hours.   Walking will help get rid of the air and reduce the bloated feeling in your belly (abdomen).   No driving for 24 hours (because of the medicine (anesthesia) used during the test).   You may shower.   Do not sign any important legal documents or operate any machinery for 24 hours (because of the anesthesia used during the test).    NUTRITION  Drink plenty of fluids.   You may resume your normal diet as instructed by your doctor.   Begin with a light meal and progress to your normal diet. Heavy or fried foods are harder to digest and may make you feel sick to your stomach (nauseated).   Avoid alcoholic beverages for 24 hours or as instructed.    MEDICATIONS  You may resume your normal medications.   WHAT YOU CAN EXPECT  TODAY  Some feelings of bloating in the abdomen.   Passage of more gas than usual.   Spotting of blood in your stool or on the toilet paper  .  IF YOU HAD POLYPS REMOVED DURING THE ENDOSCOPY:  Eat a soft diet IF YOU HAVE NAUSEA, BLOATING, ABDOMINAL PAIN, OR VOMITING.    FINDING OUT THE RESULTS OF YOUR TEST Not all test results are available during your visit. DR. Oneida Alar WILL CALL YOU WITHIN 14 DAYS OF YOUR PROCEDUE WITH YOUR RESULTS. Do not assume everything is normal if you have not heard from DR. Diallo Ponder, CALL HER OFFICE AT [phone].  SEEK IMMEDIATE MEDICAL ATTENTION AND CALL THE OFFICE: [phone] IF:  You have more than a spotting of blood in your stool.   Your belly is swollen (abdominal distention).   You are nauseated or vomiting.   You have a temperature over 101F.   You have abdominal pain or discomfort that is severe or gets worse throughout the day.    Gastritis  Gastritis is an inflammation (the body's way of reacting to injury and/or infection) of the stomach.  It is often caused by bacterial (germ) infections. It can also be caused BY ASPIRIN, BC/GOODY POWDER'S, (IBUPROFEN) MOTRIN, OR ALEVE (NAPROXEN), chemicals (including alcohol), SPICY FOODS, and medications. This illness may be associated with generalized malaise (feeling tired, not well), UPPER ABDOMINAL STOMACH cramps, and fever. One common bacterial cause of gastritis is an organism known as H. Pylori. This  can be treated with antibiotics.     Lactose Free Diet Lactose is a carbohydrate that is found mainly in milk and milk products, as well as in foods with added milk or whey. Lactose must be digested by the enzyme in order to be used by the body. Lactose intolerance occurs when there is a shortage of lactase. When your body is not able to digest lactose, you may feel sick to your stomach (nausea), bloating, cramping, gas and diarrhea.  There are many dairy products that may be tolerated better than  milk by some people:  The use of cultured dairy products such as yogurt, buttermilk, cottage cheese, and sweet acidophilus milk (Kefir) for lactase-deficient individuals is usually well tolerated. This is because the healthy bacteria help digest lactose.   Lactose-hydrolyzed milk (Lactaid) contains 40-90% less lactose than milk and may also be well tolerated.   SPECIAL NOTES  Lactose is a carbohydrates. The major food source is dairy products. Reading food labels is important. Many products contain lactose even when they are not made from milk. Look for the following words: whey, milk solids, dry milk solids, nonfat dry milk powder. Typical sources of lactose other than dairy products include breads, candies, cold cuts, prepared and processed foods, and commercial sauces and gravies.   All foods must be prepared without milk, cream, or other dairy foods.   Soy milk and lactose-free supplements (LACTASE) may be used as an alternative to milk.   FOOD GROUP ALLOWED/RECOMMENDED AVOID/USE SPARINGLY  BREADS / STARCHES 4 servings or more* Breads and rolls made without milk. [place], [place], or [place] bread. Breads and rolls that contain milk. Prepared mixes such as muffins, biscuits, waffles, pancakes. Sweet rolls, donuts, [place] toast (if made with milk or lactose).  Crackers: Soda crackers, graham crackers. Any crackers prepared without lactose. Zwieback crackers, corn curls, or any that contain lactose.  Cereals: Cooked or dry cereals prepared without lactose (read labels). Cooked or dry cereals prepared with lactose (read labels). Total, Cocoa Krispies. Special K.  Potatoes / Pasta / Rice: Any prepared without milk or lactose. Popcorn. Instant potatoes, frozen [place] fries, scalloped or au gratin potatoes.  VEGETABLES 2 servings or more Fresh, frozen, and canned vegetables. Creamed or breaded vegetables. Vegetables in a cheese sauce or with lactose-containing margarines.  FRUIT 2 servings or  more All fresh, canned, or frozen fruits that are not processed with lactose. Any canned or frozen fruits processed with lactose.  MEAT & SUBSTITUTES 2 servings or more (4 to 6 oz. total per day) Plain beef, chicken, fish, [place], lamb, veal, pork, or ham. Kosher prepared meat products. Strained or junior meats that do not contain milk. Eggs, soy meat substitutes, nuts. Scrambled eggs, omelets, and souffles that contain milk. Creamed or breaded meat, fish, or fowl. Sausage products such as wieners, liver sausage, or cold cuts that contain milk solids. Cheese, cottage cheese, or cheese spreads.  MILK None. (See BEVERAGES for milk substitutes. See DESSERTS for ice cream and frozen desserts.) Milk (whole, 2%, skim, or chocolate). Evaporated, powdered, or condensed milk; malted milk.  SOUPS & COMBINATION FOODS Bouillon, broth, vegetable soups, clear soups, consomms. Homemade soups made with allowed ingredients. Combination or prepared foods that do not contain milk or milk products (read labels). Cream soups, chowders, commercially prepared soups containing lactose. Macaroni and cheese, pizza. Combination or prepared foods that contain milk or milk products.  DESSERTS & SWEETS In moderation Water and fruit ices; gelatin; angel food cake. Homemade cookies, pies, or cakes  made from allowed ingredients. Pudding (if made with water or a milk substitute). Lactose-free tofu desserts. Sugar, honey, corn syrup, jam, jelly; marmalade; molasses (beet sugar); Pure sugar candy; marshmallows. Ice cream, ice milk, sherbet, custard, pudding, frozen yogurt. Commercial cake and cookie mixes. Desserts that contain chocolate. Pie crust made with milk-containing margarine; reduced-calorie desserts made with a sugar substitute that contains lactose. Toffee, peppermint, butterscotch, chocolate, caramels.  FATS & OILS In moderation Butter (as tolerated; contains very small amounts of lactose). Margarines and dressings that do  not contain milk, Vegetable oils, shortening, Miracle Whip, mayonnaise, nondairy cream & whipped toppings without lactose or milk solids added (examples: Coffee Rich, Carnation Coffeemate, Rich's Whipped Topping, PolyRich). Berniece Salines. Margarines and salad dressings containing milk; cream, cream cheese; peanut butter with added milk solids, sour cream, chip dips, made with sour cream.  BEVERAGES Carbonated drinks; tea; coffee and freeze-dried coffee; some instant coffees (check labels). Fruit drinks; fruit and vegetable juice; Rice or Soy milk. Ovaltine, hot chocolate. Some cocoas; some instant coffees; instant iced teas; powdered fruit drinks (read labels).   CONDIMENTS / MISCELLANEOUS Soy sauce, carob powder, olives, gravy made with water, baker's cocoa, pickles, pure seasonings and spices, wine, pure monosodium glutamate, catsup, mustard. Some chewing gums, chocolate, some cocoas. Certain antibiotics and vitamin / mineral preparations. Spice blends if they contain milk products. MSG extender. Artificial sweeteners that contain lactose such as Equal (Nutra-Sweet) and Sweet 'n Low. Some nondairy creamers (read labels).   SAMPLE MENU*  Breakfast   Orange Juice.  Banana.   Bran flakes.   Nondairy Creamer.  [place] Bread (toasted).   Butter or milk-free margarine.   Coffee or tea.    Noon Meal   Chicken Breast.  Rice.   Green beans.   Butter or milk-free margarine.  Fresh melon.   Coffee or tea.    Evening Meal   Roast Beef.  Baked potato.   Butter or milk-free margarine.   Broccoli.   Lettuce salad with vinegar and oil dressing.  [employer]   Coffee or tea.      Low-Fat Diet BREADS, CEREALS, PASTA, RICE, DRIED PEAS, AND BEANS These products are high in carbohydrates and most are low in fat. Therefore, they can be increased in the diet as substitutes for fatty foods. They too, however, contain calories and should not be eaten in excess. Cereals can be eaten  for snacks as well as for breakfast.  Include foods that contain fiber (fruits, vegetables, whole grains, and legumes). Research shows that fiber may lower blood cholesterol levels, especially the water-soluble fiber found in fruits, vegetables, oat products, and legumes. FRUITS AND VEGETABLES It is good to eat fruits and vegetables. Besides being sources of fiber, both are rich in vitamins and some minerals. They help you get the daily allowances of these nutrients. Fruits and vegetables can be used for snacks and desserts. MEATS Limit lean meat, chicken, [place], and fish to no more than 6 ounces per day. Beef, Pork, and Lamb Use lean cuts of beef, pork, and lamb. Lean cuts include:  Extra-lean ground beef.  Arm roast.  Sirloin tip.  Center-cut ham.  Round steak.  Loin chops.  Rump roast.  Tenderloin.  Trim all fat off the outside of meats before cooking. It is not necessary to severely decrease the intake of red meat, but lean choices should be made. Lean meat is rich in protein and contains a highly absorbable form of iron. Premenopausal women, in particular, should avoid reducing lean  red meat because this could increase the risk for low red blood cells (iron-deficiency anemia). The organ meats, such as liver, sweetbreads, kidneys, and brain are very rich in cholesterol. They should be limited. Chicken and [place] These are good sources of protein. The fat of poultry can be reduced by removing the skin and underlying fat layers before cooking. Chicken and [place] can be substituted for lean red meat in the diet. Poultry should not be fried or covered with high-fat sauces. Fish and Shellfish Fish is a good source of protein. Shellfish contain cholesterol, but they usually are low in saturated fatty acids. The preparation of fish is important. Like chicken and [place], they should not be fried or covered with high-fat sauces. EGGS Egg whites contain no fat or cholesterol. They can be eaten  often. Try 1 to 2 egg whites instead of whole eggs in recipes or use egg substitutes that do not contain yolk. MILK AND DAIRY PRODUCTS Use skim or 1% milk instead of 2% or whole milk. Decrease whole milk, natural, and processed cheeses. Use nonfat or low-fat (2%) cottage cheese or low-fat cheeses made from vegetable oils. Choose nonfat or low-fat (1 to 2%) yogurt. Experiment with evaporated skim milk in recipes that call for heavy cream. Substitute low-fat yogurt or low-fat cottage cheese for sour cream in dips and salad dressings. Have at least 2 servings of low-fat dairy products, such as 2 glasses of skim (or 1%) milk each day to help get your daily calcium intake.  FATS AND OILS Reduce the total intake of fats, especially saturated fat. Butterfat, lard, and beef fats are high in saturated fat and cholesterol. These should be avoided as much as possible. Vegetable fats do not contain cholesterol, but certain vegetable fats, such as coconut oil, palm oil, and palm kernel oil are very high in saturated fats. These should be limited. These fats are often used in bakery goods, processed foods, popcorn, oils, and nondairy creamers. Vegetable shortenings and some peanut butters contain hydrogenated oils, which are also saturated fats. Read the labels on these foods and check for saturated vegetable oils. Unsaturated vegetable oils and fats do not raise blood cholesterol. However, they should be limited because they are fats and are high in calories. Total fat should still be limited to 30% of your daily caloric intake. Desirable liquid vegetable oils are corn oil, cottonseed oil, olive oil, canola oil, safflower oil, soybean oil, and sunflower oil. Peanut oil is not as good, but small amounts are acceptable. Buy a heart-healthy tub margarine that has no partially hydrogenated oils in the ingredients. Mayonnaise and salad dressings often are made from unsaturated fats, but they should also be limited because of  their high calorie and fat content. Seeds, nuts, peanut butter, olives, and avocados are high in fat, but the fat is mainly the unsaturated type. These foods should be limited mainly to avoid excess calories and fat. OTHER EATING TIPS Snacks  Most sweets should be limited as snacks. They tend to be rich in calories and fats, and their caloric content outweighs their nutritional value. Some good choices in snacks are graham crackers, melba toast, soda crackers, bagels (no egg), English muffins, fruits, and vegetables. These snacks are preferable to snack crackers, [place] fries, and chips. Popcorn should be air-popped or cooked in small amounts of liquid vegetable oil. Desserts Eat fruit, low-fat yogurt, and fruit ices. AVOID pastries, cake, and cookies. Sherbet, angel food cake, gelatin dessert, frozen low-fat yogurt, or other frozen products that  do not contain saturated fat (pure fruit juice bars, frozen ice pops) are also acceptable.  COOKING METHODS Choose those methods that use little or no fat. They include: Poaching.  Braising.  Steaming.  Grilling.  Baking.  Stir-frying.  Broiling.  Microwaving.  Foods can be cooked in a nonstick pan without added fat, or use a nonfat cooking spray in regular cookware. Limit fried foods and avoid frying in saturated fat. Add moisture to lean meats by using water, broth, cooking wines, and other nonfat or low-fat sauces along with the cooking methods mentioned above. Soups and stews should be chilled after cooking. The fat that forms on top after a few hours in the refrigerator should be skimmed off. When preparing meals, avoid using excess salt. Salt can contribute to raising blood pressure in some people. EATING AWAY FROM HOME Order entres, potatoes, and vegetables without sauces or butter. When meat exceeds the size of a deck of cards (3 to 4 ounces), the rest can be taken home for another meal. Choose vegetable or fruit salads and ask for low-calorie  salad dressings to be served on the side. Use dressings sparingly. Limit high-fat toppings, such as bacon, crumbled eggs, cheese, sunflower seeds, and olives. Ask for heart-healthy tub margarine instead of butter.  Hemorrhoids Hemorrhoids are dilated (enlarged) veins around the rectum. Sometimes clots will form in the veins. This makes them swollen and painful. These are called thrombosed hemorrhoids. Causes of hemorrhoids include:  Constipation.   Straining to have a bowel movement.   HEAVY LIFTING  HOME CARE INSTRUCTIONS  Eat a well balanced diet and drink 6 to 8 glasses of water every day to avoid constipation. You may also use a bulk laxative.   Avoid straining to have bowel movements.   Keep anal area dry and clean.   Do not use a donut shaped pillow or sit on the toilet for long periods. This increases blood pooling and pain.   Move your bowels when your body has the urge; this will require less straining and will decrease pain and pressure.

## 2016-03-27 NOTE — Op Note (Signed)
[facility] Patient Name: Rebecca Hernandez Procedure Date: 03/27/2016 9:20 AM MRN: [id number] Date of Birth: [birth year] Attending MD: Barney Drain , MD CSN: [id number] Age: 46 Admit Type: Outpatient Procedure:                Upper GI endoscopy with cold forceps biopsy Indications:              Dyspepsia, Diarrhea Providers:                Barney Drain, MD, Janeece Riggers, RN, [facility],                            Technician Referring MD:             Delphina Cahill, MD Medicines:                TCS + Midazolam 2 mg IV Complications:            No immediate complications. Estimated Blood Loss:     Estimated blood loss was minimal. Procedure:                Pre-Anesthesia Assessment:                           - Prior to the procedure, a History and Physical                            was performed, and patient medications and                            allergies were reviewed. The patient's tolerance of                            previous anesthesia was also reviewed. The risks                            and benefits of the procedure and the sedation                            options and risks were discussed with the patient.                            All questions were answered, and informed consent                            was obtained. Prior Anticoagulants: The patient has                            taken no previous anticoagulant or antiplatelet                            agents. ASA Grade Assessment: I - A normal, healthy                            patient. After reviewing the risks and benefits,  the patient was deemed in satisfactory condition to                            undergo the procedure. After obtaining informed                            consent, the endoscope was passed under direct                            vision. Throughout the procedure, the patient's                            blood pressure, pulse, and oxygen saturations were                         monitored continuously. The [id number] ([id number])                            scope was introduced through the mouth, and                            advanced to the second part of duodenum. The upper                            GI endoscopy was accomplished without difficulty.                            The patient tolerated the procedure well. Scope In: 9:23:00 AM Scope Out: 9:30:36 AM Total Procedure Duration: 0 hours 7 minutes 36 seconds  Findings:      The examined esophagus was normal.      Patchy mild inflammation characterized by erythema was found in the       gastric antrum. Biopsies were taken with a cold forceps for Helicobacter       pylori testing.      The examined duodenum was normal. Biopsies for histology were taken with       a cold forceps for evaluation of celiac disease. Impression:               - NO SOURCE FOR DIARRHEA/ABDOMINAL PAIN/ANEMIA                            IDENTIFIED                           - MILD Gastritis. Biopsied. Moderate Sedation:      Moderate (conscious) sedation was administered by the endoscopy nurse       and supervised by the endoscopist. The following parameters were       monitored: oxygen saturation, heart rate, blood pressure, and response       to care. Total physician intraservice time was 44 minutes. Recommendation:           - Await pathology results.                           - Low fat diet and lactose free diet.                           -  Continue present medications.                           - Return to my office in 3 months.                           - Patient has a contact number available for                            emergencies. The signs and symptoms of potential                            delayed complications were discussed with the                            patient. Return to normal activities tomorrow.                            Written discharge instructions were provided to the                             patient. Procedure Code(s):        --- Professional ---                           [phone], Esophagogastroduodenoscopy, flexible,                            transoral; with biopsy, single or multiple                           99152, Moderate sedation services provided by the                            same physician or other qualified health care                            professional performing the diagnostic or                            therapeutic service that the sedation supports,                            requiring the presence of an independent trained                            observer to assist in the monitoring of the                            patient's level of consciousness and physiological                            status; initial 15 minutes of intraservice time,  patient age 72 years or older                           [phone], Moderate sedation services; each additional                            15 minutes intraservice time                           [phone], Moderate sedation services; each additional                            15 minutes intraservice time Diagnosis Code(s):        --- Professional ---                           K29.70, Gastritis, unspecified, without bleeding                           R10.13, Epigastric pain                           R19.7, Diarrhea, unspecified CPT copyright 2016 [employer]. All rights reserved. The codes documented in this report are preliminary and upon coder review may  be revised to meet current compliance requirements. Barney Drain, MD Barney Drain, MD 03/27/2016 10:07:37 AM This report has been signed electronically. Number of Addenda: 0

## 2016-03-27 NOTE — H&P (Signed)
Primary Care Physician:  Wende Neighbors, MD Primary Gastroenterologist:  Dr. Oneida Alar  Pre-Procedure History & Physical: HPI:  Rebecca Hernandez is a 46 y.o. female here for Diarrhea/anemia.  Past Medical History:  Diagnosis Date  . Eczema   . GERD (gastroesophageal reflux disease)   . Migraine headache     Past Surgical History:  Procedure Laterality Date  . CHOLECYSTECTOMY N/A last year sept. 2015  . LAPAROSCOPIC ENDOMETRIOSIS FULGURATION      Prior to Admission medications   Medication Sig Start Date End Date Taking? Authorizing Provider  Sod Picosulfate-Mag Ox-Cit Acd ([place]) 10-3.5-12 MG-GM-GM PACK Take 1 Container by mouth as directed. 03/08/16  Yes Danie Binder, MD    Allergies as of 03/08/2016 - Review Complete 03/07/2016  Allergen Reaction Noted  . Dilaudid [hydromorphone hcl] Shortness Of Breath 09/03/2013    Family History  Problem Relation Age of Onset  . Diabetes Maternal Uncle   . Allergic rhinitis Neg Hx   . Angioedema Neg Hx   . Asthma Neg Hx   . Atopy Neg Hx   . Eczema Neg Hx   . Immunodeficiency Neg Hx   . Urticaria Neg Hx   . Celiac disease Neg Hx   . Colon cancer Neg Hx     Social History   Social History  . Marital status: Married    Spouse name: N/A  . Number of children: 0  . Years of education: N/A   Occupational History  . Air cabin crew of [employer]    Social History Main Topics  . Smoking status: Never Smoker  . Smokeless tobacco: Never Used     Comment: Never smoked  . Alcohol use 0.6 oz/week    1 Glasses of wine per week     Comment: rare  . Drug use: No  . Sexual activity: Not on file   Other Topics Concern  . Not on file   Social History Narrative   She is from [place]. Husband is in the [employer]. She moved to the state 6 years ago.    Review of Systems: See HPI, otherwise negative ROS   Physical Exam: BP 122/68   Pulse 78   Temp 98.6 F (37 C) (Oral)   Resp 15   Ht 5\' 6"  (1.676 m)   Wt 192 lb (87.1 kg)    LMP 03/06/2016 (Exact Date)   SpO2 99%   BMI 30.99 kg/m  General:   Alert,  pleasant and cooperative in NAD Head:  Normocephalic and atraumatic. Neck:  Supple; Lungs:  Clear throughout to auscultation.    Heart:  Regular rate and rhythm. Abdomen:  Soft, nontender and nondistended. Normal bowel sounds, without guarding, and without rebound.   Neurologic:  Alert and  oriented x4;  grossly normal neurologically.  Impression/Plan:     Diarrhea/anemia  PLAN: TCS/EGD TODAY WITH BIOPSY. DISCUSSED PROCEDURE, BENEFITS, & RISKS: < 1% chance of medication reaction, bleeding, perforation, or rupture of spleen/liver.

## 2016-03-31 ENCOUNTER — Encounter (HOSPITAL_COMMUNITY): Payer: Self-pay | Admitting: Gastroenterology

## 2016-04-03 ENCOUNTER — Telehealth: Payer: Self-pay | Admitting: Gastroenterology

## 2016-04-03 MED ORDER — CLARITHROMYCIN 500 MG PO TABS: ORAL_TABLET | ORAL | 0 refills | Status: DC

## 2016-04-03 MED ORDER — OMEPRAZOLE 20 MG PO CPDR: DELAYED_RELEASE_CAPSULE | ORAL | 0 refills | Status: DC

## 2016-04-03 MED ORDER — AMOXICILLIN 500 MG PO TABS: ORAL_TABLET | ORAL | 0 refills | Status: DC

## 2016-04-03 NOTE — Telephone Encounter (Signed)
PLEASE CAL PT. NO SOURCE FOR HER DIARRHEA WAS IDENTIFIED. Her colon BIOPSIES are normal. Her stomach Bx showed H. Pylori infection AND THE SMALL BOWEL BIOPSIES DO NOT SHOW CELIAC SPRUE. SHE needs AMOXICILLIN 500 mg 2 po BID for 10 days and Biaxin 500 mg po bid for 10 days. She needs Omeprazole 20 mg BID for 10 days then 1 po DAILY for 3 mos. Med side effects include NAUSEA, VOMITING, DIARRHEA, ABDOMINAL pain, and metallic taste.   AVOID FOODS THAT CAUSE YOUR SYMPTOMS TO FLARE. FOLLOW A LOW FAT/DAIRY FREE DIET. AVOID ITEMS THAT CAUSE BLOATING.   FOLLOW UP IN 3 MOS E30 dX: diarrhea/abdominal pain.  Next colonoscopy IN 10 YEARS.

## 2016-04-04 NOTE — Telephone Encounter (Signed)
Tried to call pt and mailbox is full, could not leave a message.

## 2016-04-04 NOTE — Telephone Encounter (Signed)
Pt called back for DS, but DS not available. Pt said that she would call back later.Marland Kitchen

## 2016-04-04 NOTE — Telephone Encounter (Signed)
PT is aware.

## 2016-06-20 ENCOUNTER — Ambulatory Visit (HOSPITAL_COMMUNITY)
Admission: RE | Admit: 2016-06-20 | Discharge: 2016-06-20 | Disposition: A | Discharge status: Home / Self Care | Source: Ambulatory Visit | Attending: Internal Medicine | Admitting: Internal Medicine

## 2016-06-20 ENCOUNTER — Other Ambulatory Visit (HOSPITAL_COMMUNITY): Payer: Self-pay | Admitting: Internal Medicine

## 2016-06-20 DIAGNOSIS — R197 Diarrhea, unspecified: Secondary | ICD-10-CM | POA: Insufficient documentation

## 2016-06-20 DIAGNOSIS — Z9049 Acquired absence of other specified parts of digestive tract: Secondary | ICD-10-CM | POA: Insufficient documentation

## 2016-06-20 DIAGNOSIS — R1110 Vomiting, unspecified: Secondary | ICD-10-CM | POA: Insufficient documentation

## 2016-06-20 DIAGNOSIS — N200 Calculus of kidney: Secondary | ICD-10-CM | POA: Diagnosis not present

## 2016-06-20 DIAGNOSIS — R109 Unspecified abdominal pain: Secondary | ICD-10-CM

## 2016-06-20 MED ORDER — IOPAMIDOL (ISOVUE-300) INJECTION 61%
INTRAVENOUS | Status: AC
  Filled 2016-06-20: qty 30

## 2016-06-27 ENCOUNTER — Encounter: Payer: Self-pay | Admitting: Gastroenterology

## 2016-06-27 ENCOUNTER — Ambulatory Visit (INDEPENDENT_AMBULATORY_CARE_PROVIDER_SITE_OTHER): Admitting: Gastroenterology

## 2016-06-27 VITALS — BP 110/68 | HR 72 | Temp 97.1°F | Ht 66.0 in | Wt 194.0 lb

## 2016-06-27 DIAGNOSIS — R197 Diarrhea, unspecified: Secondary | ICD-10-CM | POA: Diagnosis not present

## 2016-06-27 DIAGNOSIS — B9681 Helicobacter pylori [H. pylori] as the cause of diseases classified elsewhere: Secondary | ICD-10-CM | POA: Diagnosis not present

## 2016-06-27 DIAGNOSIS — R894 Abnormal immunological findings in specimens from other organs, systems and tissues: Secondary | ICD-10-CM

## 2016-06-27 DIAGNOSIS — K2970 Gastritis, unspecified, without bleeding: Secondary | ICD-10-CM

## 2016-06-27 DIAGNOSIS — R109 Unspecified abdominal pain: Secondary | ICD-10-CM | POA: Diagnosis not present

## 2016-06-27 NOTE — Patient Instructions (Signed)
1. I will review labs from PCP. We will plan on checking celiac panel in 3 weeks after you reinitiate gluten into your diet. We may add additional labs if needed once I review labs from your PCP.  2. Trial of bentyl 10mg  each morning before breakfast. May take even if you don't eat. Up to four times daily for abdominal pain, diarrhea. RX sent to pharmacy.  3. Consume LOW FODMAP foods. See list provided. Avoid HIGH FODMAP foods.  4. Return to the office in 8 weeks.

## 2016-06-27 NOTE — Progress Notes (Signed)
Primary Care Physician: Celene Squibb, MD  Primary Gastroenterologist:  Barney Drain, MD   Chief Complaint  Patient presents with  . Diarrhea  . Nausea  . Abdominal Pain    HPI: Rebecca Hernandez is a 46 y.o. female here for follow-up. Seen back in February for abdominal pain, nausea, diarrhea. Symptoms persisting for more than 2 years now.  Patient saw an allergist Dr. Ishmael Holter back in 2016 for suspected food allergies. There was suspected alpha gal mammalian allergy although lab testing did not support this finding. Other food testing included pineapple, pepper, onion, garlic, tomato, milk, shellfish which were unremarkable. Previously her PCP checked allergen gluten, IgG test that was positive. Her TTG, IgA level was negative. Patient reports that over the course of past couple of years she's had issues with anything she eats. She has went through dairy lamination, gluten elimination, predominantly eating low FODMAP foods. Eating only bland items. Continues to have nausea all the time. Hasn't had solid food in a week. Diarrhea frequently. "Feels hollow inside". Crampy abdominal pain prior to bowel movement and every time she eats. No rectal bleeding or melena. Lots of gurgling after meals. Stool frequency has increased, also with more watery stools. Cannot recall when she had her last solid stool. The other day she had a small muffin and within 40 minutes she had diarrhea.  Last week she had vomiting. She's been consuming Gatorade. She eats Jasmine rice, gluten-free Pasta, lentils the completely avoids dairy. Never feels hungry.  Since her last office visit she had an EGD and colonoscopy by Dr. Oneida Alar back in March. She had H. pylori gastritis. She was able to complete antibiotics. Terminal ileum appeared normal. She had external and internal hemorrhoids. Redundant colon. Random colon biopsies were benign. Duodenal biopsy showed focal increased intraepithelial lymphocyte, no evidence of  villous blunting or dysplasia. Under the microscopic, met his stated that these findings could be early onset or treated celiac disease however peptic duodenitis should be ruled out clinically.  PCP ordered a CT that and pelvis without contrast on 06/20/2016, moderate amount of fecal retention throughout the large bowel to the level of the distal sigmoid, appendix normal, punctate left upper pole renal calculus nonobstructive.   No current outpatient prescriptions on file.   No current facility-administered medications for this visit.     Allergies as of 06/27/2016 - Review Complete 06/27/2016  Allergen Reaction Noted  . Dilaudid [hydromorphone hcl] Shortness Of Breath 09/03/2013    ROS:  General: Negative for anorexia, weight loss, fever, chills, fatigue, weakness. ENT: Negative for hoarseness, difficulty swallowing , nasal congestion. CV: Negative for chest pain, angina, palpitations, dyspnea on exertion, peripheral edema.  Respiratory: Negative for dyspnea at rest, dyspnea on exertion, cough, sputum, wheezing.  GI: See history of present illness. GU:  Negative for dysuria, hematuria, urinary incontinence, urinary frequency, nocturnal urination.  Endo: Negative for unusual weight change.    Physical Examination:   BP 110/68   Pulse 72   Temp 97.1 F (36.2 C) (Oral)   Ht 5' 6"  (1.676 m)   Wt 194 lb (88 kg)   BMI 31.31 kg/m   General: Well-nourished, well-developed in no acute distress.  Eyes: No icterus. Mouth: Oropharyngeal mucosa moist and pink , no lesions erythema or exudate. Lungs: Clear to auscultation bilaterally.  Heart: Regular rate and rhythm, no murmurs rubs or gallops.  Abdomen: Bowel sounds are normal, nontender, nondistended, no hepatosplenomegaly or masses, no abdominal bruits or hernia , no  rebound or guarding.   Extremities: No lower extremity edema. No clubbing or deformities. Neuro: Alert and oriented x 4   Skin: Warm and dry, no jaundice.   Psych:  Alert and cooperative, normal mood and affect.  Imaging Studies: Ct Abdomen Pelvis Wo Contrast  Result Date: 06/20/2016 CLINICAL DATA:  Lower abdominal pain with nausea, vomiting and diarrhea times several months worsening over the past 2 days. EXAM: CT ABDOMEN AND PELVIS WITHOUT CONTRAST TECHNIQUE: Multidetector CT imaging of the abdomen and pelvis was performed following the standard protocol without IV contrast. COMPARISON:  None. FINDINGS: Lower chest: No acute abnormality. Hepatobiliary: Cholecystectomy. Unremarkable noncontrast appearance of the liver. No biliary dilatation. Pancreas: Normal Spleen: Normal Adrenals/Urinary Tract: Normal bilateral adrenal glands. No obstructive uropathy. A punctate left upper pole nonobstructing calculus is noted. The urinary bladder is normal. Stomach/Bowel: Nondistended contrast filled stomach. Normal small bowel rotation without obstruction. Contrast reaches the cecum were there is a moderate amount of fecal retention throughout large bowel to the level of the distal sigmoid. No annular constricting lesions or inflammation. Appendix is normal. Vascular/Lymphatic: No significant vascular findings are present. No enlarged abdominal or pelvic lymph nodes. Reproductive: Uterus and bilateral adnexa are unremarkable. Other: No abdominal wall hernia or abnormality. No abdominopelvic ascites. Musculoskeletal: No acute or significant osseous findings. Transitional lumbosacral anatomy. IMPRESSION: 1. Increased colonic stool burden without bowel obstruction or inflammation. 2. Cholecystectomy without biliary dilatation choledocholithiasis. 3. Punctate left upper pole renal calculus. No obstructive uropathy. Electronically Signed   By: Ashley Royalty M.D.   On: 06/20/2016 18:40

## 2016-06-30 NOTE — Assessment & Plan Note (Addendum)
46 year old [place] female with over 2 year history of postprandial abdominal cramping, nausea, diarrhea. Multiple allergy testing previously Dr. Ishmael Holter in 2016. She also has a history of allergen gluten, IgG test that was positive performed by PCP. Back in February when we saw her, she had artery practically eliminated all gluten in her diet. EGD findings as outlined, pathology with questionable early onset versus treated celiac versus peptic duodenitis. Patient was advised at gluten back to her diet prior to procedures but is unclear whether or not she did this to a significant degree.  At this point would offer repeating celiac serologies in the setting of gluten challenge. Continue to avoid dairy. Retrieve recent blood work. Further recommendations to follow.  Trial of Bentyl. Low FODMAP diet.

## 2016-06-30 NOTE — Assessment & Plan Note (Signed)
Status post treatment earlier this year. We will need to check for eradication. Patient states that she had recent serologies will like for me to review those first. Requested records.

## 2016-07-03 NOTE — Progress Notes (Signed)
CC'ED TO PCP 

## 2016-07-14 NOTE — Progress Notes (Signed)
Received labs dated 06/21/2016 Hemoglobin A1c 6.0, TSH 0.601, white blood cell 5700, hemoglobin 11.1 normal, hematocrit 35.5, MCV 82, platelets 427,000 high, glucose 102, BUN 9, creatinine 0.75, total bilirubin 1.0, alkaline phosphatase 82, AST 17, ALT 13.  H pylori IgG elevated at 3.73, H. pylori IgA elevated at 18.4, H. pylori IgM less than 9.  PLEASE TELL PATIENT THAT I REVIEWED HER LABS. We still need to have her do H.pylori stool antigen test when she does her celiac labs.   Please place orders.

## 2016-07-17 ENCOUNTER — Other Ambulatory Visit: Payer: Self-pay

## 2016-07-17 DIAGNOSIS — R197 Diarrhea, unspecified: Secondary | ICD-10-CM

## 2016-07-17 NOTE — Progress Notes (Signed)
Pt is aware and will next week and pick up order and container and do along with the celiac labs. She is out town this week.

## 2016-07-17 NOTE — Progress Notes (Signed)
LMOM for a return call. Orders entered for the H Pylori stool antigen and container and lab orders at front for pick up.

## 2016-08-28 ENCOUNTER — Ambulatory Visit: Admitting: Gastroenterology

## 2016-09-01 NOTE — Progress Notes (Signed)
REVIEWED-NO ADDITIONAL RECOMMENDATIONS. 

## 2016-10-17 ENCOUNTER — Telehealth: Payer: Self-pay | Admitting: Gastroenterology

## 2016-10-17 ENCOUNTER — Ambulatory Visit: Admitting: Gastroenterology

## 2016-10-17 ENCOUNTER — Encounter: Payer: Self-pay | Admitting: Gastroenterology

## 2016-10-17 NOTE — Telephone Encounter (Signed)
PATIENT WAS A NO SHOW AND LETTER SENT  °

## 2017-12-03 ENCOUNTER — Ambulatory Visit
Admission: RE | Admit: 2017-12-03 | Discharge: 2017-12-03 | Disposition: A | Discharge status: Home / Self Care | Source: Ambulatory Visit | Attending: Otolaryngology | Admitting: Otolaryngology

## 2017-12-03 ENCOUNTER — Other Ambulatory Visit: Payer: Self-pay | Admitting: Otolaryngology

## 2017-12-03 DIAGNOSIS — J320 Chronic maxillary sinusitis: Secondary | ICD-10-CM

## 2017-12-03 DIAGNOSIS — J322 Chronic ethmoidal sinusitis: Secondary | ICD-10-CM

## 2018-09-17 ENCOUNTER — Encounter: Payer: Self-pay | Admitting: Neurology

## 2018-09-17 ENCOUNTER — Other Ambulatory Visit: Payer: Self-pay

## 2018-09-17 ENCOUNTER — Ambulatory Visit (INDEPENDENT_AMBULATORY_CARE_PROVIDER_SITE_OTHER): Admitting: Neurology

## 2018-09-17 VITALS — BP 116/76 | HR 74 | Temp 96.8°F | Ht 66.0 in | Wt 208.0 lb

## 2018-09-17 DIAGNOSIS — R202 Paresthesia of skin: Secondary | ICD-10-CM

## 2018-09-17 MED ORDER — GABAPENTIN 100 MG PO CAPS: 100.0000 mg | ORAL_CAPSULE | Freq: Three times a day (TID) | ORAL | 2 refills | Status: DC

## 2018-09-17 NOTE — Patient Instructions (Signed)
Start gabapentin 100 mg three times a day.  Neurontin (gabapentin) may result in drowsiness, ankle swelling, gait instability, or possibly dizziness. Please contact our office if significant side effects occur with this medication.

## 2018-09-17 NOTE — Progress Notes (Signed)
Reason for visit: Facial numbness and pain  Referring physician: Dr. [name] is a 48 y.o. female  History of present illness:  Rebecca Hernandez is a 48 year old right-handed white female with a 41-month history of left facial numbness and discomfort.  She initially went to her dentist to make sure there was no problem with a dental abscess, no issues were discovered.  She was seen by Dr. Ernesto Rutherford who felt that she had a sinus infection and treated her with antibiotics and this seemed to help some of her issues.  She however continued to have left facial numbness and a dull achy pain in the left face mainly in the V2 and V3 distributions.  She occasionally have some numbness that crosses midline to the right side of the face.  The most persistent numbness is in the chin area on the left.  Occasionally, she may have some slight numbness into the forehead.  She is having discomfort at all times, this may keep her from sleeping well at night.  She has also noted onset of swollen lymph nodes on the left face area, she may have some swelling of the left face to the point where the left eye tends to close down slightly.  Treatment with antibiotics will help the symptoms but when she goes off antibiotics her symptoms recur.  She did have some issues with diet, she reports a tick bite 2 years ago and was felt possibly to have alpha gal syndrome but this was not confirmed on blood work.  The patient does have some slight soreness in the throat, she does have ear pain, no infection has been documented in these areas.  She denies any significant neck pain, she denies any weakness of the extremities but she does have some numbness on the anterolateral aspect of her right thigh and on the back of the hands on both sides.  She has an itching sensation on the hands.  She denies any vision changes or difficulty with swallowing.  She denies issues with balance or difficulty controlling the bowels or the  bladder.  She is sent to this office for an evaluation.  She has not had any fevers, chills, or night sweats.  She has gained weight over the last year or so.  Past Medical History:  Diagnosis Date   Bell's palsy    Less than 48 years old    Eczema    GERD (gastroesophageal reflux disease)    Left-sided face pain    Migraine headache     Past Surgical History:  Procedure Laterality Date   BIOPSY N/A 03/27/2016   Procedure: BIOPSY small bowel;  Surgeon: Danie Binder, MD;  Location: [facility];  Service: Endoscopy;  Laterality: N/A;  small bowel biopsy   CHOLECYSTECTOMY N/A last year sept. 2015   COLONOSCOPY N/A 03/27/2016   Procedure: COLONOSCOPY;  Surgeon: Danie Binder, MD;  Location: [facility];  Service: Endoscopy;  Laterality: N/A;  8:30am   ESOPHAGOGASTRODUODENOSCOPY N/A 03/27/2016   Procedure: ESOPHAGOGASTRODUODENOSCOPY (EGD);  Surgeon: Danie Binder, MD;  Location: [facility];  Service: Endoscopy;  Laterality: N/A;   LAPAROSCOPIC ENDOMETRIOSIS FULGURATION      Family History  Problem Relation Age of Onset   Healthy Mother    Healthy Father    Diabetes Maternal Uncle    Allergic rhinitis Neg Hx    Angioedema Neg Hx    Asthma Neg Hx    Atopy Neg Hx  Eczema Neg Hx    Immunodeficiency Neg Hx    Urticaria Neg Hx    Celiac disease Neg Hx    Colon cancer Neg Hx     Social history:  reports that she has never smoked. She has never used smokeless tobacco. She reports current alcohol use of about 1.0 standard drinks of alcohol per week. She reports that she does not use drugs.  Medications:  Prior to Admission medications   Medication Sig Start Date End Date Taking? Authorizing Provider  gabapentin (NEURONTIN) 100 MG capsule Only takes if pain is severe 09/06/18  Yes [provider]  carbamazepine (TEGRETOL XR) 100 MG 12 hr tablet TK 1 T PO BID FOR TRIGEMINAL NEURALGIA 09/13/18   [provider]      Allergies  Allergen  Reactions   Dilaudid [Hydromorphone Hcl] Shortness Of Breath    Disoriented. Burning sensation constant.    ROS:  Out of a complete 14 system review of symptoms, the patient complains only of the following symptoms, and all other reviewed systems are negative.  Facial numbness and pain Right thigh numbness  Blood pressure 116/76, pulse 74, temperature (!) 96.8 F (36 C), temperature source Temporal, height 5\' 6"  (1.676 m), weight 208 lb (94.3 kg).  Physical Exam  General: The patient is alert and cooperative at the time of the examination.  The patient is moderately obese.  Eyes: Pupils are equal, round, and reactive to light. Discs are flat bilaterally.  Neck: The neck is supple, no carotid bruits are noted.  Respiratory: The respiratory examination is clear.  Cardiovascular: The cardiovascular examination reveals a regular rate and rhythm, no obvious murmurs or rubs are noted.  Neuromuscular: Range move the cervical spine is full.  Skin: Extremities are without significant edema.  Neurologic Exam  Mental status: The patient is alert and oriented x 3 at the time of the examination. The patient has apparent normal recent and remote memory, with an apparently normal attention span and concentration ability.  Cranial nerves: Facial symmetry is present. There is good sensation of the face to pinprick and soft touch bilaterally, with exception of a slight decrease in pinprick sensation on the left chin area. The strength of the facial muscles and the muscles to head turning and shoulder shrug are normal bilaterally. Speech is well enunciated, no aphasia or dysarthria is noted. Extraocular movements are full. Visual fields are full. The tongue is midline, and the patient has symmetric elevation of the soft palate. No obvious hearing deficits are noted.  Motor: The motor testing reveals 5 over 5 strength of all 4 extremities. Good symmetric motor tone is noted throughout.  Sensory:  Sensory testing is intact to pinprick, soft touch, vibration sensation, and position sense on all 4 extremities, with exception of a slight sensation decreased on the left foot as compared to the right. No evidence of extinction is noted.  Coordination: Cerebellar testing reveals good finger-nose-finger and heel-to-shin bilaterally.  Gait and station: Gait is normal. Tandem gait is normal. Romberg is negative. No drift is seen.  Reflexes: Deep tendon reflexes are symmetric and normal bilaterally. Toes are downgoing bilaterally.   Assessment/Plan:  1.  Atypical facial pain, left  The patient is reporting a dull achy sensation in the left lower face, with some symptoms on the right as well.  She reports numbness in the V2 and V3 distributions.  The description of her symptoms is not consistent with trigeminal neuralgia.  She reports episodes of facial swelling and  swollen lymph nodes, the etiology of this is not clear.  Further blood work will be done today.  She will have MRI of the brain done with and without contrast.  Gabapentin will be started on a scheduled basis, the patient was taking this on an as-needed basis.  She will not take the carbamazepine, she has not started this medication.  The patient will follow-up in 3 months.  She will call for any dose adjustments of the gabapentin.  Jill Alexanders MD 09/17/2018 9:16 AM  [facility] [address] [place], [place] [address]  Phone [phone] Fax [phone]

## 2018-09-19 LAB — COMPREHENSIVE METABOLIC PANEL
ALT: 32 IU/L (ref 0–32)
AST: 23 IU/L (ref 0–40)
Albumin/Globulin Ratio: 1.5 (ref 1.2–2.2)
Albumin: 4.1 g/dL (ref 3.8–4.8)
Alkaline Phosphatase: 88 IU/L (ref 39–117)
BUN/Creatinine Ratio: 10 (ref 9–23)
BUN: 9 mg/dL (ref 6–24)
Bilirubin Total: 0.7 mg/dL (ref 0.0–1.2)
CO2: 20 mmol/L (ref 20–29)
Calcium: 9.1 mg/dL (ref 8.7–10.2)
Chloride: 106 mmol/L (ref 96–106)
Creatinine, Ser: 0.87 mg/dL (ref 0.57–1.00)
GFR calc Af Amer: 92 mL/min/{1.73_m2} (ref >59)
GFR calc non Af Amer: 80 mL/min/{1.73_m2} (ref >59)
Globulin, Total: 2.7 g/dL (ref 1.5–4.5)
Glucose: 194 mg/dL — ABNORMAL HIGH (ref 65–99)
Potassium: 4.8 mmol/L (ref 3.5–5.2)
Sodium: 138 mmol/L (ref 134–144)
Total Protein: 6.8 g/dL (ref 6.0–8.5)

## 2018-09-19 LAB — VITAMIN B12: Vitamin B-12: 559 pg/mL (ref 232–1245)

## 2018-09-19 LAB — PAN-ANCA
ANCA Proteinase 3: 3.9 U/mL — ABNORMAL HIGH (ref 0.0–3.5)
Atypical pANCA: <1:20 {titer}
C-ANCA: <1:20 {titer}
Myeloperoxidase Ab: <9 U/mL (ref 0.0–9.0)
P-ANCA: <1:20 {titer}

## 2018-09-19 LAB — B. BURGDORFI ANTIBODIES: Lyme IgG/IgM Ab: <0.91 {ISR} (ref 0.00–0.90)

## 2018-09-19 LAB — ANGIOTENSIN CONVERTING ENZYME: Angio Convert Enzyme: 41 U/L (ref 14–82)

## 2018-09-19 LAB — RPR: RPR Ser Ql: NONREACTIVE

## 2018-09-19 LAB — SEDIMENTATION RATE: Sed Rate: 28 mm/hr (ref 0–32)

## 2018-09-19 LAB — RHEUMATOID FACTOR: Rheumatoid fact SerPl-aCnc: <10 IU/mL (ref 0.0–13.9)

## 2018-09-19 LAB — ANA W/REFLEX: Anti Nuclear Antibody (ANA): NEGATIVE

## 2018-09-24 ENCOUNTER — Telehealth: Payer: Self-pay | Admitting: Neurology

## 2018-09-24 NOTE — Telephone Encounter (Signed)
[place] no auth required order sent to GI. They will reach out to the patient to schedule.

## 2018-10-11 ENCOUNTER — Other Ambulatory Visit: Payer: Self-pay

## 2018-10-11 ENCOUNTER — Ambulatory Visit
Admission: RE | Admit: 2018-10-11 | Discharge: 2018-10-11 | Disposition: A | Discharge status: Home / Self Care | Source: Ambulatory Visit | Attending: Neurology | Admitting: Neurology

## 2018-10-11 DIAGNOSIS — R202 Paresthesia of skin: Secondary | ICD-10-CM | POA: Diagnosis not present

## 2018-10-11 MED ORDER — GADOBENATE DIMEGLUMINE 529 MG/ML IV SOLN
20.0000 mL | Freq: Once | INTRAVENOUS | Status: AC | PRN
  Administered 2018-10-11: 14:00:00 20 mL via INTRAVENOUS

## 2018-10-14 ENCOUNTER — Telehealth: Payer: Self-pay | Admitting: Neurology

## 2018-10-14 NOTE — Telephone Encounter (Signed)
I called the patient.  MRI of the brain is essentially normal, very minimal punctate white matter changes seen.  Nothing present that would be causing facial numbness.  We will treat medically, the patient is on very low-dose gabapentin, if the dose is well-tolerated but she is still having a lot of difficulties, she will contact our office and we will go up on the medication.    MRI brain 10/11/18:  IMPRESSION: This MRI of the brain with and without contrast shows the following: 1.  Few punctate T2/flair hyperintense foci in the subcortical and and deep white matter.  This is a nonspecific finding and could be incidental or related to minimal chronic microvascular ischemic change or sequela of migraine headaches.  The pattern is not typical for demyelination. 2.   There are no acute findings and there is a normal enhancement pattern.

## 2018-10-24 ENCOUNTER — Other Ambulatory Visit: Payer: Self-pay

## 2018-10-24 ENCOUNTER — Other Ambulatory Visit (HOSPITAL_COMMUNITY): Payer: Self-pay | Admitting: Otolaryngology

## 2018-10-24 ENCOUNTER — Other Ambulatory Visit: Payer: Self-pay | Admitting: Otolaryngology

## 2018-10-24 ENCOUNTER — Ambulatory Visit (INDEPENDENT_AMBULATORY_CARE_PROVIDER_SITE_OTHER): Admitting: Otolaryngology

## 2018-10-24 DIAGNOSIS — D37030 Neoplasm of uncertain behavior of the parotid salivary glands: Secondary | ICD-10-CM

## 2018-10-24 DIAGNOSIS — K118 Other diseases of salivary glands: Secondary | ICD-10-CM

## 2018-11-11 ENCOUNTER — Other Ambulatory Visit: Payer: Self-pay

## 2018-11-11 ENCOUNTER — Ambulatory Visit (HOSPITAL_COMMUNITY)
Admission: RE | Admit: 2018-11-11 | Discharge: 2018-11-11 | Disposition: A | Discharge status: Home / Self Care | Source: Ambulatory Visit | Attending: Otolaryngology | Admitting: Otolaryngology

## 2018-11-11 DIAGNOSIS — K118 Other diseases of salivary glands: Secondary | ICD-10-CM | POA: Insufficient documentation

## 2018-11-11 MED ORDER — IOHEXOL 300 MG/ML  SOLN
75.0000 mL | Freq: Once | INTRAMUSCULAR | Status: AC | PRN
  Administered 2018-11-11: 75 mL via INTRAVENOUS

## 2018-11-21 ENCOUNTER — Ambulatory Visit (INDEPENDENT_AMBULATORY_CARE_PROVIDER_SITE_OTHER): Admitting: Otolaryngology

## 2018-11-21 DIAGNOSIS — K1123 Chronic sialoadenitis: Secondary | ICD-10-CM | POA: Diagnosis not present

## 2018-11-21 DIAGNOSIS — D37030 Neoplasm of uncertain behavior of the parotid salivary glands: Secondary | ICD-10-CM

## 2018-11-21 DIAGNOSIS — J342 Deviated nasal septum: Secondary | ICD-10-CM

## 2018-11-21 DIAGNOSIS — J343 Hypertrophy of nasal turbinates: Secondary | ICD-10-CM

## 2018-11-21 DIAGNOSIS — J310 Chronic rhinitis: Secondary | ICD-10-CM | POA: Diagnosis not present

## 2019-02-03 ENCOUNTER — Encounter: Payer: Self-pay | Admitting: Neurology

## 2019-02-03 ENCOUNTER — Ambulatory Visit: Admitting: Neurology

## 2019-02-03 ENCOUNTER — Telehealth: Payer: Self-pay | Admitting: Neurology

## 2019-02-03 NOTE — Telephone Encounter (Signed)
This patient did not show for a revisit appointment today. 

## 2019-02-03 NOTE — Progress Notes (Deleted)
Reason for visit: Facial numbness and pain  Referring physician: Dr. Leanora Ivanoff Rebecca Hernandez is a 49 y.o. female  History of present illness:  Consult visit 09/17/2018 Dr. Jannifer Franklin: Rebecca Hernandez is a 49 year old right-handed white female with a 8-month history of left facial numbness and discomfort.  She initially went to her dentist to make sure there was no problem with a dental abscess, no issues were discovered.  She was seen by Dr. Ernesto Rutherford who felt that she had a sinus infection and treated her with antibiotics and this seemed to help some of her issues.  She however continued to have left facial numbness and a dull achy pain in the left face mainly in the V2 and V3 distributions.  She occasionally have some numbness that crosses midline to the right side of the face.  The most persistent numbness is in the chin area on the left.  Occasionally, she may have some slight numbness into the forehead.  She is having discomfort at all times, this may keep her from sleeping well at night.  She has also noted onset of swollen lymph nodes on the left face area, she may have some swelling of the left face to the point where the left eye tends to close down slightly.  Treatment with antibiotics will help the symptoms but when she goes off antibiotics her symptoms recur.  She did have some issues with diet, she reports a tick bite 2 years ago and was felt possibly to have alpha gal syndrome but this was not confirmed on blood work.  The patient does have some slight soreness in the throat, she does have ear pain, no infection has been documented in these areas.  She denies any significant neck pain, she denies any weakness of the extremities but she does have some numbness on the anterolateral aspect of her right thigh and on the back of the hands on both sides.  She has an itching sensation on the hands.  She denies any vision changes or difficulty with swallowing.  She denies issues with balance or  difficulty controlling the bowels or the bladder.  She is sent to this office for an evaluation.  She has not had any fevers, chills, or night sweats.  She has gained weight over the last year or so.  Update 02/03/2019: Rebecca Hernandez is a 49 year old female who is being seen today for 8-month follow-up.  Last seen by Dr. Jannifer Franklin in 09/2018 with complaints of left facial numbness and pain.  Obtain MRI w/wo contrast on 10/11/2018 which did not show any acute abnormalities contributing to symptoms.  Previously on as needed gabapentin and recommended initiating scheduled dosing at 100 mg 3 times daily.      Past Medical History:  Diagnosis Date  . Bell's palsy    Less than 36 years old   . Eczema   . GERD (gastroesophageal reflux disease)   . Left-sided face pain   . Migraine headache     Past Surgical History:  Procedure Laterality Date  . BIOPSY N/A 03/27/2016   Procedure: BIOPSY small bowel;  Surgeon: Danie Binder, MD;  [facility];  Service: Endoscopy;  Laterality: N/A;  small bowel biopsy  . CHOLECYSTECTOMY N/A last year sept. 2015  . COLONOSCOPY N/A 03/27/2016   Procedure: COLONOSCOPY;  Surgeon: Danie Binder, MD;  [facility];  Service: Endoscopy;  Laterality: N/A;  8:30am  . ESOPHAGOGASTRODUODENOSCOPY N/A 03/27/2016   Procedure: ESOPHAGOGASTRODUODENOSCOPY (EGD);  Surgeon: Danie Binder, MD;  [facility];  Service: Endoscopy;  Laterality: N/A;  . LAPAROSCOPIC ENDOMETRIOSIS FULGURATION      Family History  Problem Relation Age of Onset  . Healthy Mother   . Healthy Father   . Diabetes Maternal Uncle   . Allergic rhinitis Neg Hx   . Angioedema Neg Hx   . Asthma Neg Hx   . Atopy Neg Hx   . Eczema Neg Hx   . Immunodeficiency Neg Hx   . Urticaria Neg Hx   . Celiac disease Neg Hx   . Colon cancer Neg Hx     Social history:  reports that she has never smoked. She has never used smokeless tobacco. She reports current alcohol use of about 1.0  standard drinks of alcohol per week. She reports that she does not use drugs.  Medications:  Prior to Admission medications   Medication Sig Start Date End Date Taking? Authorizing Provider  gabapentin (NEURONTIN) 100 MG capsule Only takes if pain is severe 09/06/18  Yes [provider]  carbamazepine (TEGRETOL XR) 100 MG 12 hr tablet TK 1 T PO BID FOR TRIGEMINAL NEURALGIA 09/13/18   [provider]      Allergies  Allergen Reactions  . Dilaudid [Hydromorphone Hcl] Shortness Of Breath    Disoriented. Burning sensation constant.    ROS:  Out of a complete 14 system review of symptoms, the patient complains only of the following symptoms, and all other reviewed systems are negative.  Facial numbness and pain Right thigh numbness  There were no vitals taken for this visit.  Physical Exam  General: The patient is alert and cooperative at the time of the examination.  The patient is moderately obese.  Eyes: Pupils are equal, round, and reactive to light. Discs are flat bilaterally.  Neck: The neck is supple, no carotid bruits are noted.  Respiratory: The respiratory examination is clear.  Cardiovascular: The cardiovascular examination reveals a regular rate and rhythm, no obvious murmurs or rubs are noted.  Neuromuscular: Range move the cervical spine is full.  Skin: Extremities are without significant edema.  Neurologic Exam  Mental status: The patient is alert and oriented x 3 at the time of the examination. The patient has apparent normal recent and remote memory, with an apparently normal attention span and concentration ability.  Cranial nerves: Facial symmetry is present. There is good sensation of the face to pinprick and soft touch bilaterally, with exception of a slight decrease in pinprick sensation on the left chin area. The strength of the facial muscles and the muscles to head turning and shoulder shrug are normal bilaterally. Speech is well  enunciated, no aphasia or dysarthria is noted. Extraocular movements are full. Visual fields are full. The tongue is midline, and the patient has symmetric elevation of the soft palate. No obvious hearing deficits are noted.  Motor: The motor testing reveals 5 over 5 strength of all 4 extremities. Good symmetric motor tone is noted throughout.  Sensory: Sensory testing is intact to pinprick, soft touch, vibration sensation, and position sense on all 4 extremities, with exception of a slight sensation decreased on the left foot as compared to the right. No evidence of extinction is noted.  Coordination: Cerebellar testing reveals good finger-nose-finger and heel-to-shin bilaterally.  Gait and station: Gait is normal. Tandem gait is normal. Romberg is negative. No drift is seen.  Reflexes: Deep tendon reflexes are symmetric and normal bilaterally. Toes are downgoing bilaterally.  Assessment/Plan:  1.  Atypical facial pain, left  The patient is reporting a dull achy sensation in the left lower face, with some symptoms on the right as well.  She reports numbness in the V2 and V3 distributions.  The description of her symptoms is not consistent with trigeminal neuralgia.  She reports episodes of facial swelling and swollen lymph nodes, the etiology of this is not clear.  Further blood work will be done today.  She will have MRI of the brain done with and without contrast.  Gabapentin will be started on a scheduled basis, the patient was taking this on an as-needed basis.  She will not take the carbamazepine, she has not started this medication.  The patient will follow-up in 3 months.  She will call for any dose adjustments of the gabapentin.   Frann Rider, AGNP-BC  [facility] [address] [place], [place] [address]  Phone [phone] Fax [phone] Note: This document was prepared with digital dictation and possible smart phrase technology. Any  transcriptional errors that result from this process are unintentional.

## 2019-06-24 ENCOUNTER — Other Ambulatory Visit (HOSPITAL_BASED_OUTPATIENT_CLINIC_OR_DEPARTMENT_OTHER): Payer: Self-pay | Admitting: Internal Medicine

## 2019-06-24 DIAGNOSIS — Z1231 Encounter for screening mammogram for malignant neoplasm of breast: Secondary | ICD-10-CM

## 2019-06-26 ENCOUNTER — Ambulatory Visit (HOSPITAL_BASED_OUTPATIENT_CLINIC_OR_DEPARTMENT_OTHER)
Admission: RE | Admit: 2019-06-26 | Discharge: 2019-06-26 | Disposition: A | Discharge status: Home / Self Care | Source: Ambulatory Visit | Attending: Internal Medicine | Admitting: Internal Medicine

## 2019-06-26 ENCOUNTER — Other Ambulatory Visit: Payer: Self-pay

## 2019-06-26 DIAGNOSIS — Z1231 Encounter for screening mammogram for malignant neoplasm of breast: Secondary | ICD-10-CM | POA: Diagnosis present

## 2019-07-31 ENCOUNTER — Other Ambulatory Visit

## 2019-10-30 ENCOUNTER — Ambulatory Visit (INDEPENDENT_AMBULATORY_CARE_PROVIDER_SITE_OTHER): Admitting: Otolaryngology

## 2019-11-04 ENCOUNTER — Ambulatory Visit (INDEPENDENT_AMBULATORY_CARE_PROVIDER_SITE_OTHER): Admitting: Otolaryngology

## 2019-11-04 ENCOUNTER — Encounter (INDEPENDENT_AMBULATORY_CARE_PROVIDER_SITE_OTHER): Payer: Self-pay | Admitting: Otolaryngology

## 2019-11-04 ENCOUNTER — Other Ambulatory Visit: Payer: Self-pay

## 2019-11-04 VITALS — Temp 97.9°F

## 2019-11-04 DIAGNOSIS — J029 Acute pharyngitis, unspecified: Secondary | ICD-10-CM | POA: Diagnosis not present

## 2019-11-04 DIAGNOSIS — R591 Generalized enlarged lymph nodes: Secondary | ICD-10-CM

## 2019-11-04 NOTE — Progress Notes (Addendum)
HPI: Rebecca Hernandez is a 49 y.o. female who presents for evaluation of left sided ear, throat, neck and facial discomfort. She describes sharp pains in her throat when she swallows at different times. She also notices these pains occasionally when she moves her neck. She notices discomfort when she pushes in the submandibular area in her ear as well as her throat. She has been followed by neurology and has had MRI scan and CT scan performed that did not demonstrate any obvious abnormality although she did have a slightly enlarged left parotid lymph node.  This appeared reactive otherwise.  She also had a an infected cyst from her scalp on the left side at that time. She has also noticed some tinnitus which is very light only in the left ear.  She occasionally has popping crackling and discomfort in the ear when she swallows. She did have a CT scan of her neck performed a year ago and on review of this this showed clear paranasal sinuses.  Of note she did have a slightly enlarged about a 1-1/2 cm left parotid lymph node.  Past Medical History:  Diagnosis Date  . Bell's palsy    Less than 21 years old   . Eczema   . GERD (gastroesophageal reflux disease)   . Left-sided face pain   . Migraine headache    Past Surgical History:  Procedure Laterality Date  . BIOPSY N/A 03/27/2016   Procedure: BIOPSY small bowel;  Surgeon: Danie Binder, MD;  [facility];  Service: Endoscopy;  Laterality: N/A;  small bowel biopsy  . CHOLECYSTECTOMY N/A last year sept. 2015  . COLONOSCOPY N/A 03/27/2016   Procedure: COLONOSCOPY;  Surgeon: Danie Binder, MD;  [facility];  Service: Endoscopy;  Laterality: N/A;  8:30am  . ESOPHAGOGASTRODUODENOSCOPY N/A 03/27/2016   Procedure: ESOPHAGOGASTRODUODENOSCOPY (EGD);  Surgeon: Danie Binder, MD;  [facility];  Service: Endoscopy;  Laterality: N/A;  . LAPAROSCOPIC ENDOMETRIOSIS FULGURATION     Social History   Socioeconomic History   . Marital status: Married    Spouse name: Not on file  . Number of children: 0  . Years of education: Not on file  . Highest education level: Not on file  Occupational History  . Occupation: Air cabin crew of [employer]  Tobacco Use  . Smoking status: Never Smoker  . Smokeless tobacco: Never Used  . Tobacco comment: Never smoked  Substance and Sexual Activity  . Alcohol use: Yes    Alcohol/week: 1.0 standard drink    Types: 1 Glasses of wine per week    Comment: rare  . Drug use: No  . Sexual activity: Not on file  Other Topics Concern  . Not on file  Social History Narrative   She is from [place]. Husband is in the [employer]. She moved to the state 6 years ago.   Right handed   Lives at home with husband    Caffeine~ 1 cup per day    Social Determinants of Health   Financial Resource Strain:   . Difficulty of Paying Living Expenses: Not on file  Food Insecurity:   . Worried About Charity fundraiser in the Last Year: Not on file  . Ran Out of Food in the Last Year: Not on file  Transportation Needs:   . Lack of Transportation (Medical): Not on file  . Lack of Transportation (Non-Medical): Not on file  Physical Activity:   . Days of Exercise per  Week: Not on file  . Minutes of Exercise per Session: Not on file  Stress:   . Feeling of Stress : Not on file  Social Connections:   . Frequency of Communication with Friends and Family: Not on file  . Frequency of Social Gatherings with Friends and Family: Not on file  . Attends Religious Services: Not on file  . Active Member of Clubs or Organizations: Not on file  . Attends Archivist Meetings: Not on file  . Marital Status: Not on file   Family History  Problem Relation Age of Onset  . Healthy Mother   . Healthy Father   . Diabetes Maternal Uncle   . Allergic rhinitis Neg Hx   . Angioedema Neg Hx   . Asthma Neg Hx   . Atopy Neg Hx   . Eczema Neg Hx   . Immunodeficiency Neg Hx   . Urticaria Neg Hx   .  Celiac disease Neg Hx   . Colon cancer Neg Hx    Allergies  Allergen Reactions  . Dilaudid [Hydromorphone Hcl] Shortness Of Breath    Disoriented. Burning sensation constant.   Prior to Admission medications   Medication Sig Start Date End Date Taking? Authorizing Provider  gabapentin (NEURONTIN) 100 MG capsule Take 1 capsule (100 mg total) by mouth 3 (three) times daily. 09/17/18  Yes Kathrynn Ducking, MD     Positive ROS: Otherwise negative  All other systems have been reviewed and were otherwise negative with the exception of those mentioned in the HPI and as above.  Physical Exam: Constitutional: Alert, well-appearing, no acute distress Ears: External ears without lesions or tenderness. Ear canals are clear bilaterally.  Left TM is clear with good mobility on pneumatic otoscopy.  She has had no hearing problems.  Hearing screening with tuning forks revealed symmetric hearing. Nasal: External nose without lesions. Septum with minimal deformity and mild rhinitis.. Clear nasal passages otherwise.  No signs of infection. Oral: Lips and gums without lesions. Tongue and palate mucosa without lesions. Posterior oropharynx clear.  Tonsil regions appear benign bilaterally with no exudate.  Indirect laryngoscopy revealed a clear base of tongue.  Palpation of the floor mouth and left semitubular gland revealed no palpable masses.  She did have 1 small submandibular lymph node approximately 1 cm in size but not really tender to palpation. Fiberoptic laryngoscopy was performed through the left nostril.  The nasopharynx was clear.  The base of tongue vallecula epiglottis were normal.  The hypopharynx and larynx were clear. Neck: She describes swelling in pain when she probes around the left submandibular gland posteriorly and on examination of this there is no enlargement of the semitubular gland and no significant adenopathy noted although she does have 1 small lymph node posterior to the gland.   Drainage from the gland is clear.  No significant palpable adenopathy along the jugular chain of lymph nodes or in the posterior neck or supraclavicular region.  Palpation of the left parotid gland reveals a small lymph node but this is barely palpable.  No other palpable masses within the parotid gland. Respiratory: Breathing comfortably  Skin: No facial/neck lesions or rash noted.  She does have a cyst on the right upper scalp that is nontender.  Procedures  Assessment: Intermittent sore throat as well as apparent history of swelling pain and discomfort on the left side of her face and left ear.  Essentially normal examination of the head and neck in the office today with no  palpable masses or significant adenopathy.  I do not identify any sign of infection or neoplasm.  She apparently has had this problem for close to 3 years and has seen urology. 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  Plan: Recommended use of nonsteroidal anti-inflammatory medications for the discomfort.  Possible throat lozenges when she is having a sore throat although she has normal hypopharynx and larynx on fiberoptic laryngoscopy as well as indirect laryngoscopy. She will follow-up if she notices any enlargement of her lymph nodes.  Radene Journey, MD

## 2020-05-27 ENCOUNTER — Other Ambulatory Visit (HOSPITAL_COMMUNITY): Payer: Self-pay | Admitting: Family Medicine

## 2020-05-27 DIAGNOSIS — Z1231 Encounter for screening mammogram for malignant neoplasm of breast: Secondary | ICD-10-CM

## 2020-06-16 ENCOUNTER — Encounter: Payer: Self-pay | Admitting: Internal Medicine

## 2020-06-29 ENCOUNTER — Encounter: Payer: Self-pay | Admitting: *Deleted

## 2020-06-29 NOTE — Progress Notes (Signed)
Cardiology Office Note   Date:  06/30/2020   ID:  Rebecca Hernandez, DOB Jun 14, 1970, MRN [id number]  PCP:  Celene Squibb, MD  Cardiologist:   None Referring:  Celene Squibb, MD  Chief Complaint  Patient presents with   Palpitations       History of Present Illness: Rebecca Hernandez is a 50 y.o. female who was referred by Celene Squibb, MD for evaluation of shortness of breath and palpitations.  She said that she has been getting a sensation of her heart quivering.  Its like a wave of emptiness that comes into her chest.  Its been happening sporadically.  It is probably been happening for several months.  Its not happening with exercise.  It lasts for 10 to 15 seconds at a time.  Her bigger complaint has been profound fatigue with any activity such as just walking into the office today.  She is also been recently found to have a hemoglobin A1c of 9.5.  She does have insomnia but her fatigue has been worse for longer than she has been having problems sleeping.  She does get shortness of breath with activity but she is not describing any chest pressure, neck or arm discomfort.  She has not had any presyncope or syncope.  She is not describing PND or orthopnea.  She has not had prior cardiac testing.  Past Medical History:  Diagnosis Date   Bell's palsy    Less than 29 years old    Diabetes ([place])    Eczema    GERD (gastroesophageal reflux disease)    Left-sided face pain    Migraine headache     Past Surgical History:  Procedure Laterality Date   BIOPSY N/A 03/27/2016   Procedure: BIOPSY small bowel;  Surgeon: Danie Binder, MD;  [facility];  Service: Endoscopy;  Laterality: N/A;  small bowel biopsy   CHOLECYSTECTOMY N/A last year sept. 2015   COLONOSCOPY N/A 03/27/2016   Procedure: COLONOSCOPY;  Surgeon: Danie Binder, MD;  [facility];  Service: Endoscopy;  Laterality: N/A;  8:30am   ESOPHAGOGASTRODUODENOSCOPY N/A 03/27/2016   Procedure:  ESOPHAGOGASTRODUODENOSCOPY (EGD);  Surgeon: Danie Binder, MD;  [facility];  Service: Endoscopy;  Laterality: N/A;   LAPAROSCOPIC ENDOMETRIOSIS FULGURATION       No current outpatient medications on file.   No current facility-administered medications for this visit.    Allergies:   Dilaudid [hydromorphone hcl]    Social History:  The patient  reports that she has never smoked. She has never used smokeless tobacco. She reports current alcohol use of about 1.0 standard drink of alcohol per week. She reports that she does not use drugs.   Family History:  The patient's family history includes Diabetes in her maternal uncle; Healthy in her father and mother; Heart attack in her maternal grandmother.    ROS:  Please see the history of present illness.   Otherwise, review of systems are positive for she does have alpha gal.  She had a problem recently with H. pylori.   All other systems are reviewed and negative.    PHYSICAL EXAM: VS:  BP 124/78   Pulse 84   Ht 5\' 6"  (1.676 m)   Wt 211 lb 9.6 oz (96 kg)   SpO2 97%   BMI 34.15 kg/m  , BMI Body mass index is 34.15 kg/m. GENERAL:  Well appearing HEENT:  Pupils equal round and reactive, fundi not visualized,  oral mucosa unremarkable NECK:  No jugular venous distention, waveform within normal limits, carotid upstroke brisk and symmetric, no bruits, no thyromegaly LYMPHATICS:  No cervical, inguinal adenopathy LUNGS:  Clear to auscultation bilaterally BACK:  No CVA tenderness CHEST:  Unremarkable HEART:  PMI not displaced or sustained,S1 and S2 within normal limits, no S3, no S4, no clicks, no rubs, no murmurs ABD:  Flat, positive bowel sounds normal in frequency in pitch, no bruits, no rebound, no guarding, no midline pulsatile mass, no hepatomegaly, no splenomegaly EXT:  2 plus pulses throughout, no edema, no cyanosis no clubbing SKIN:  No rashes no nodules NEURO:  Cranial nerves II through XII grossly intact, motor  grossly intact throughout PSYCH:  Cognitively intact, oriented to person place and time    EKG:  EKG is ordered today. The ekg ordered today demonstrates sinus rhythm, rate 84, axis within normal limits, intervals within normal limits, no acute ST-T wave changes.   Recent Labs: No results found for requested labs within last 8760 hours.    Lipid Panel    Component Value Date/Time   CHOL 162 09/03/2013 1016   TRIG 57 09/03/2013 1016   HDL 48 09/03/2013 1016   CHOLHDL 3.4 09/03/2013 1016   LDLCALC 103 (H) 09/03/2013 1016      Wt Readings from Last 3 Encounters:  06/30/20 211 lb 9.6 oz (96 kg)  09/17/18 208 lb (94.3 kg)  06/27/16 194 lb (88 kg)      Other studies Reviewed: Additional studies/ records that were reviewed today include: Labs. Review of the above records demonstrates:  Please see elsewhere in the note.     ASSESSMENT AND PLAN:  SOB: Patient has shortness of breath and fatigue.  She has significant cardiovascular risk factors. I will bring the patient back for a POET (Plain Old Exercise Test). This will allow me to screen for obstructive coronary disease, risk stratify and very importantly provide a prescription for exercise.  I would also like to order a coronary calcium score.  DYSLIPIDEMIA: Her LDL was 108.  Goals of therapy will be based on the testing above.  DM: We talked at length about diet.  She does not want to try medications but wants to try this with diet first.  This is under the discretion of Dr. Nevada Crane.   Current medicines are reviewed at length with the patient today.  The patient does not have concerns regarding medicines.  The following changes have been made:  no change  Labs/ tests ordered today include:   Orders Placed This Encounter  Procedures   CT CARDIAC SCORING (SELF PAY ONLY)   EXERCISE TOLERANCE TEST (ETT)   EKG 12-Lead     Disposition:   FU with me based on the results of the evaluation.   Signed, Minus Breeding, MD   06/30/2020 4:59 PM    [facility]

## 2020-06-30 ENCOUNTER — Encounter: Payer: Self-pay | Admitting: *Deleted

## 2020-06-30 ENCOUNTER — Encounter: Payer: Self-pay | Admitting: Cardiology

## 2020-06-30 ENCOUNTER — Ambulatory Visit (INDEPENDENT_AMBULATORY_CARE_PROVIDER_SITE_OTHER): Admitting: Cardiology

## 2020-06-30 VITALS — BP 124/78 | HR 84 | Ht 66.0 in | Wt 211.6 lb

## 2020-06-30 DIAGNOSIS — R0602 Shortness of breath: Secondary | ICD-10-CM

## 2020-06-30 DIAGNOSIS — R002 Palpitations: Secondary | ICD-10-CM | POA: Diagnosis not present

## 2020-06-30 NOTE — Patient Instructions (Addendum)
Medication Instructions:  Your physician recommends that you continue on your current medications as directed. Please refer to the Current Medication list given to you today.  Labwork: none  Testing/Procedures: Your physician has requested that you have an exercise tolerance test. For further information please visit [email or web address]. Please also follow instruction sheet, as given. Calcium Scoring Test   Follow-Up: Your physician recommends that you schedule a follow-up appointment in: pending  Any Other Special Instructions Will Be Listed Below (If Applicable).  If you need a refill on your cardiac medications before your next appointment, please call your pharmacy.

## 2020-07-02 ENCOUNTER — Ambulatory Visit (HOSPITAL_COMMUNITY)
Admission: RE | Admit: 2020-07-02 | Discharge: 2020-07-02 | Disposition: A | Discharge status: Home / Self Care | Source: Ambulatory Visit | Attending: Cardiology | Admitting: Cardiology

## 2020-07-02 ENCOUNTER — Other Ambulatory Visit: Payer: Self-pay

## 2020-07-02 DIAGNOSIS — R0602 Shortness of breath: Secondary | ICD-10-CM | POA: Insufficient documentation

## 2020-07-02 LAB — EXERCISE TOLERANCE TEST
Estimated workload: 10.1 METS
Exercise duration (min): 7 min
Exercise duration (sec): 27 s
MPHR: 172 {beats}/min
Peak HR: 155 {beats}/min
Percent HR: 90 %
RPE: 15
Rest HR: 76 {beats}/min

## 2020-08-03 ENCOUNTER — Ambulatory Visit
Admission: RE | Admit: 2020-08-03 | Discharge: 2020-08-03 | Disposition: A | Discharge status: Home / Self Care | Payer: Self-pay | Source: Ambulatory Visit | Attending: Cardiology | Admitting: Cardiology

## 2020-08-03 ENCOUNTER — Other Ambulatory Visit: Payer: Self-pay

## 2020-08-03 DIAGNOSIS — R0602 Shortness of breath: Secondary | ICD-10-CM

## 2020-08-17 DIAGNOSIS — E119 Type 2 diabetes mellitus without complications: Secondary | ICD-10-CM | POA: Insufficient documentation

## 2020-08-24 ENCOUNTER — Ambulatory Visit (HOSPITAL_BASED_OUTPATIENT_CLINIC_OR_DEPARTMENT_OTHER)

## 2020-09-28 ENCOUNTER — Encounter (HOSPITAL_BASED_OUTPATIENT_CLINIC_OR_DEPARTMENT_OTHER): Payer: Self-pay

## 2020-09-28 ENCOUNTER — Other Ambulatory Visit: Payer: Self-pay

## 2020-09-28 ENCOUNTER — Ambulatory Visit (HOSPITAL_BASED_OUTPATIENT_CLINIC_OR_DEPARTMENT_OTHER)
Admission: RE | Admit: 2020-09-28 | Discharge: 2020-09-28 | Disposition: A | Discharge status: Home / Self Care | Source: Ambulatory Visit | Attending: Family Medicine | Admitting: Family Medicine

## 2020-09-28 DIAGNOSIS — Z1231 Encounter for screening mammogram for malignant neoplasm of breast: Secondary | ICD-10-CM | POA: Insufficient documentation

## 2020-10-04 ENCOUNTER — Other Ambulatory Visit: Payer: Self-pay | Admitting: Family Medicine

## 2020-10-04 DIAGNOSIS — R928 Other abnormal and inconclusive findings on diagnostic imaging of breast: Secondary | ICD-10-CM

## 2020-10-18 ENCOUNTER — Ambulatory Visit
Admission: RE | Admit: 2020-10-18 | Discharge: 2020-10-18 | Disposition: A | Discharge status: Home / Self Care | Source: Ambulatory Visit | Attending: Family Medicine | Admitting: Family Medicine

## 2020-10-18 ENCOUNTER — Other Ambulatory Visit: Payer: Self-pay | Admitting: Family Medicine

## 2020-10-18 ENCOUNTER — Other Ambulatory Visit: Payer: Self-pay

## 2020-10-18 DIAGNOSIS — R928 Other abnormal and inconclusive findings on diagnostic imaging of breast: Secondary | ICD-10-CM

## 2020-12-05 DIAGNOSIS — R002 Palpitations: Secondary | ICD-10-CM | POA: Insufficient documentation

## 2021-01-13 ENCOUNTER — Other Ambulatory Visit (HOSPITAL_BASED_OUTPATIENT_CLINIC_OR_DEPARTMENT_OTHER): Payer: Self-pay

## 2021-01-13 MED ORDER — VITAMIN D3 1.25 MG (50000 UT) PO CAPS
ORAL_CAPSULE | ORAL | 0 refills | Status: DC
  Filled 2021-01-13: qty 12, 84d supply, fill #0

## 2021-04-18 ENCOUNTER — Encounter

## 2021-05-02 ENCOUNTER — Ambulatory Visit
Admission: RE | Admit: 2021-05-02 | Discharge: 2021-05-02 | Disposition: A | Discharge status: Home / Self Care | Source: Ambulatory Visit | Attending: Family Medicine | Admitting: Family Medicine

## 2021-05-02 DIAGNOSIS — R928 Other abnormal and inconclusive findings on diagnostic imaging of breast: Secondary | ICD-10-CM

## 2021-05-12 ENCOUNTER — Other Ambulatory Visit (HOSPITAL_BASED_OUTPATIENT_CLINIC_OR_DEPARTMENT_OTHER): Payer: Self-pay

## 2021-05-12 MED ORDER — METFORMIN HCL ER 500 MG PO TB24
ORAL_TABLET | ORAL | 0 refills | Status: DC
  Filled 2021-05-12: qty 30, 30d supply, fill #0

## 2021-06-09 ENCOUNTER — Other Ambulatory Visit (HOSPITAL_BASED_OUTPATIENT_CLINIC_OR_DEPARTMENT_OTHER): Payer: Self-pay

## 2021-06-09 MED ORDER — METFORMIN HCL ER 500 MG PO TB24
ORAL_TABLET | ORAL | 2 refills | Status: DC
  Filled 2021-06-09 – 2021-07-06 (×2): qty 60, 30d supply, fill #0

## 2021-06-21 ENCOUNTER — Other Ambulatory Visit (HOSPITAL_BASED_OUTPATIENT_CLINIC_OR_DEPARTMENT_OTHER): Payer: Self-pay

## 2021-07-06 ENCOUNTER — Other Ambulatory Visit (HOSPITAL_BASED_OUTPATIENT_CLINIC_OR_DEPARTMENT_OTHER): Payer: Self-pay

## 2021-08-25 ENCOUNTER — Encounter (HOSPITAL_BASED_OUTPATIENT_CLINIC_OR_DEPARTMENT_OTHER): Payer: Self-pay | Admitting: Pharmacist

## 2021-08-25 ENCOUNTER — Other Ambulatory Visit (HOSPITAL_BASED_OUTPATIENT_CLINIC_OR_DEPARTMENT_OTHER): Payer: Self-pay

## 2021-08-25 MED ORDER — METFORMIN HCL ER 750 MG PO TB24
ORAL_TABLET | ORAL | 3 refills | Status: DC
  Filled 2021-08-25: qty 60, 30d supply, fill #0
  Filled 2022-02-12 (×2): qty 60, 30d supply, fill #1
  Filled 2022-04-05: qty 60, 30d supply, fill #2

## 2021-08-25 MED ORDER — KETOCONAZOLE 2 % EX SHAM
MEDICATED_SHAMPOO | CUTANEOUS | 6 refills | Status: DC
  Filled 2021-08-25: qty 120, 30d supply, fill #0

## 2021-08-31 ENCOUNTER — Encounter (HOSPITAL_BASED_OUTPATIENT_CLINIC_OR_DEPARTMENT_OTHER): Payer: Self-pay | Admitting: Nurse Practitioner

## 2021-08-31 ENCOUNTER — Other Ambulatory Visit (HOSPITAL_BASED_OUTPATIENT_CLINIC_OR_DEPARTMENT_OTHER): Payer: Self-pay

## 2021-08-31 ENCOUNTER — Ambulatory Visit (INDEPENDENT_AMBULATORY_CARE_PROVIDER_SITE_OTHER): Admitting: Nurse Practitioner

## 2021-08-31 VITALS — BP 109/59 | HR 71 | Ht 66.0 in | Wt 203.0 lb

## 2021-08-31 DIAGNOSIS — E119 Type 2 diabetes mellitus without complications: Secondary | ICD-10-CM | POA: Diagnosis not present

## 2021-08-31 DIAGNOSIS — Z0000 Encounter for general adult medical examination without abnormal findings: Secondary | ICD-10-CM | POA: Diagnosis not present

## 2021-08-31 MED ORDER — FREESTYLE LIBRE 2 SENSOR MISC
12 refills | Status: DC
  Filled 2021-08-31 – 2021-09-05 (×2): qty 2, 28d supply, fill #0

## 2021-08-31 NOTE — Progress Notes (Signed)
Orma Render, DNP, AGNP-c [facility] [address]  [place], [place] [address] [phone]  New patient visit   Patient: Rebecca Hernandez   DOB: 09/08/1970   51 y.o. Female  MRN: [id number] Visit Date: 08/31/2021  Patient Care Team: Miosha Behe, Coralee Pesa, NP as PCP - General (Nurse Practitioner) Danie Binder, MD (Inactive) as Consulting Physician (Gastroenterology)  Today's Vitals   08/31/21 0840  BP: (!) 109/59  Pulse: 71  SpO2: 99%  Weight: 203 lb (92.1 kg)  Height: '5\' 6"'$  (1.676 m)   Body mass index is 32.77 kg/m.   Today's healthcare provider: Orma Render, NP   Chief Complaint  Patient presents with   New Patient (Initial Visit)    Patient presents today to establish care. Patient has current labs from employer   Subjective    Rebecca Hernandez is a 51 y.o. female who presents today as a new patient to establish care.    Patient endorses the following concerns presently: Diabetes Narissa tells me she has been restricting carbs to under 50 per day to help manage her blood sugars.  She has been walking daily for exercise She does occasionally fast- no breakfast and no lunch She has increased her water intake She is checking her blood sugars about 1.5 hour after eating- they are usually controlled (less than 180) Taking '1500mg'$  twice a day of metformin She denies vision changes, increased hunger, increased thirst, increased urination, weakness, infections/wounds.    History reviewed and reveals the following: Past Medical History:  Diagnosis Date   Bell's palsy    Less than 17 years old    Diabetes ([place])    Eczema    GERD (gastroesophageal reflux disease)    Left-sided face pain    Migraine headache    Past Surgical History:  Procedure Laterality Date   BIOPSY N/A 03/27/2016   Procedure: BIOPSY small bowel;  Surgeon: Danie Binder, MD;  [facility];  Service: Endoscopy;  Laterality:  N/A;  small bowel biopsy   CHOLECYSTECTOMY N/A last year sept. 2015   COLONOSCOPY N/A 03/27/2016   Procedure: COLONOSCOPY;  Surgeon: Danie Binder, MD;  [facility];  Service: Endoscopy;  Laterality: N/A;  8:30am   ESOPHAGOGASTRODUODENOSCOPY N/A 03/27/2016   Procedure: ESOPHAGOGASTRODUODENOSCOPY (EGD);  Surgeon: Danie Binder, MD;  [facility];  Service: Endoscopy;  Laterality: N/A;   LAPAROSCOPIC ENDOMETRIOSIS FULGURATION     Family Status  Relation Name Status   Mother  Alive   Father  Alive   Sister  Alive   Sister  Alive   Brother  Alive   Brother  Alive   MGM  Deceased   Mat Uncle  (Not Specified)   Neg Hx  (Not Specified)   Family History  Problem Relation Age of Onset   Healthy Mother    Healthy Father    Heart attack Maternal Grandmother    Diabetes Maternal Uncle    Allergic rhinitis Neg Hx    Angioedema Neg Hx    Asthma Neg Hx    Atopy Neg Hx    Eczema Neg Hx    Immunodeficiency Neg Hx    Urticaria Neg Hx    Celiac disease Neg Hx    Colon cancer Neg Hx    Social History   Socioeconomic History   Marital status: Married    Spouse name: Not on file   Number of children: 0  Years of education: Not on file   Highest education level: Not on file  Occupational History   Occupation: Air cabin crew of [employer]  Tobacco Use   Smoking status: Never   Smokeless tobacco: Never   Tobacco comments:    Never smoked  Substance and Sexual Activity   Alcohol use: Yes    Alcohol/week: 1.0 standard drink of alcohol    Types: 1 Glasses of wine per week    Comment: rare   Drug use: No   Sexual activity: Not on file  Other Topics Concern   Not on file  Social History Narrative   She is from [place]. Husband is in the [employer].    Right handed   Lives at home with husband    Caffeine~ 1 cup per day    Social Determinants of Health   Financial Resource Strain: Not on file  Food Insecurity: Not on file  Transportation Needs: Not on file   Physical Activity: Not on file  Stress: Not on file  Social Connections: Not on file   Outpatient Medications Prior to Visit  Medication Sig   metFORMIN (GLUCOPHAGE-XR) 750 MG 24 hr tablet Take 2 tablets (1,500 mg) by mouth daily with evening meal   [DISCONTINUED] Cholecalciferol (VITAMIN D3) 1.25 MG (50000 UT) CAPS Take 1 capsule (50,000 units) by mouth weekly (Patient not taking: Reported on 08/31/2021)   [DISCONTINUED] ketoconazole (NIZORAL) 2 % shampoo Apply to the affected area weekly-massage onto scalp, let sit for 5 minutes then rinse. (Patient not taking: Reported on 08/31/2021)   [DISCONTINUED] metFORMIN (GLUCOPHAGE-XR) 500 MG 24 hr tablet Take 2 tablets (1,000 mg) by mouth daily with evening meal (Patient not taking: Reported on 08/31/2021)   No facility-administered medications prior to visit.   Allergies  Allergen Reactions   Dilaudid [Hydromorphone Hcl] Shortness Of Breath    Disoriented. Burning sensation constant.   Immunization History  Administered Date(s) Administered   Influenza-Unspecified 10/29/2013, 10/07/2018   Moderna Sars-Covid-2 Vaccination 03/27/2019, 04/22/2019, 12/17/2019    Review of Systems All review of systems negative except what is listed in the HPI   Objective    BP (!) 109/59   Pulse 71   Ht '5\' 6"'$  (1.676 m)   Wt 203 lb (92.1 kg)   SpO2 99%   BMI 32.77 kg/m  Physical Exam Vitals and nursing note reviewed.  Constitutional:      General: She is not in acute distress.    Appearance: Normal appearance.  Eyes:     Extraocular Movements: Extraocular movements intact.     Conjunctiva/sclera: Conjunctivae normal.     Pupils: Pupils are equal, round, and reactive to light.  Neck:     Vascular: No carotid bruit.  Cardiovascular:     Rate and Rhythm: Normal rate and regular rhythm.     Pulses: Normal pulses.     Heart sounds: Normal heart sounds. No murmur heard. Pulmonary:     Effort: Pulmonary effort is normal.     Breath sounds: Normal  breath sounds. No wheezing.  Abdominal:     General: Bowel sounds are normal.     Palpations: Abdomen is soft.  Musculoskeletal:        General: Normal range of motion.     Cervical back: Normal range of motion.     Right lower leg: No edema.     Left lower leg: No edema.  Skin:    General: Skin is warm and dry.     Capillary Refill:  Capillary refill takes less than 2 seconds.  Neurological:     General: No focal deficit present.     Mental Status: She is alert and oriented to person, place, and time.  Psychiatric:        Mood and Affect: Mood normal.        Behavior: Behavior normal.        Thought Content: Thought content normal.        Judgment: Judgment normal.     No results found for any visits on 08/31/21.  Assessment & Plan      Problem List Items Addressed This Visit     Diabetes mellitus without complication ([place]) - Primary    Chronic condition with previous control. No checking BG routinely. Concerns present over significant restriction of carbohydrates from diet. Discussion with patient on importance of carbohydrates and protein in diet to ensure overall health and blood sugar control. Avoid skipping meals, but rather eat multiple small meals throughout the day with goal to keep blood sugar in a steady state and avoid highs and lows. We will plan to trial this method for three months and follow-up with labs to ensure good control. Continue current medications. Freestyle libre ordered to monitor blood sugars and effect that specific foods have on control.       Relevant Medications   Continuous Blood Gluc Sensor (FREESTYLE LIBRE 2 SENSOR) MISC   Other Visit Diagnoses     Encounter for medical examination to establish care            Return in about 3 months (around 12/01/2021) for DM.      Virginie Josten, Coralee Pesa, NP, DNP, AGNP-C [facility]

## 2021-08-31 NOTE — Patient Instructions (Signed)
Goal:  150-180 grams of carbs divided throughout the day in meals and snacks  Be sure you are eating a small carb and protein snack before bedtime to prevent drops overnight.   2 hours after a meal, blood sugars should be 150 or less  Waking blood sugars should be between 70-110  When blood sugars are high drink a glass of water and take a 10 minute walk  When your blood sugar is dropping or if it drops below 70, I want you to eat a 15gram carb snack and then eat a small protein. If the blood sugar improves then great ,if it is still dropping repeat the cycle.

## 2021-09-05 ENCOUNTER — Other Ambulatory Visit (HOSPITAL_BASED_OUTPATIENT_CLINIC_OR_DEPARTMENT_OTHER): Payer: Self-pay

## 2021-09-06 ENCOUNTER — Other Ambulatory Visit (HOSPITAL_BASED_OUTPATIENT_CLINIC_OR_DEPARTMENT_OTHER): Payer: Self-pay

## 2021-09-07 ENCOUNTER — Other Ambulatory Visit (HOSPITAL_BASED_OUTPATIENT_CLINIC_OR_DEPARTMENT_OTHER): Payer: Self-pay

## 2021-09-08 ENCOUNTER — Telehealth (HOSPITAL_BASED_OUTPATIENT_CLINIC_OR_DEPARTMENT_OTHER): Payer: Self-pay | Admitting: Nurse Practitioner

## 2021-09-08 NOTE — Telephone Encounter (Signed)
Received a fax from covermymeds for PA for FreeStyle [place] 2 paperwork is in yellow tray. Please advise.

## 2021-09-16 ENCOUNTER — Other Ambulatory Visit (HOSPITAL_BASED_OUTPATIENT_CLINIC_OR_DEPARTMENT_OTHER): Payer: Self-pay | Admitting: Nurse Practitioner

## 2021-09-16 ENCOUNTER — Other Ambulatory Visit (HOSPITAL_BASED_OUTPATIENT_CLINIC_OR_DEPARTMENT_OTHER): Payer: Self-pay

## 2021-09-18 ENCOUNTER — Encounter (HOSPITAL_BASED_OUTPATIENT_CLINIC_OR_DEPARTMENT_OTHER): Payer: Self-pay | Admitting: Nurse Practitioner

## 2021-09-20 ENCOUNTER — Other Ambulatory Visit (HOSPITAL_BASED_OUTPATIENT_CLINIC_OR_DEPARTMENT_OTHER): Payer: Self-pay

## 2021-09-20 MED ORDER — VITAMIN D3 1.25 MG (50000 UT) PO CAPS
ORAL_CAPSULE | ORAL | 0 refills | Status: DC
  Filled 2021-09-20 – 2022-02-12 (×2): qty 12, 84d supply, fill #0
  Filled 2022-02-12: qty 12, fill #0

## 2021-09-22 NOTE — Telephone Encounter (Signed)
Prior Josem Kaufmann was sent through cover my meds by [facility]

## 2021-10-03 DIAGNOSIS — M2390 Unspecified internal derangement of unspecified knee: Secondary | ICD-10-CM | POA: Insufficient documentation

## 2021-10-03 DIAGNOSIS — M5137 Other intervertebral disc degeneration, lumbosacral region: Secondary | ICD-10-CM | POA: Insufficient documentation

## 2021-10-03 DIAGNOSIS — F4321 Adjustment disorder with depressed mood: Secondary | ICD-10-CM | POA: Insufficient documentation

## 2021-10-03 DIAGNOSIS — R944 Abnormal results of kidney function studies: Secondary | ICD-10-CM | POA: Insufficient documentation

## 2021-10-03 DIAGNOSIS — G43909 Migraine, unspecified, not intractable, without status migrainosus: Secondary | ICD-10-CM | POA: Insufficient documentation

## 2021-10-03 DIAGNOSIS — N946 Dysmenorrhea, unspecified: Secondary | ICD-10-CM | POA: Insufficient documentation

## 2021-10-03 NOTE — Assessment & Plan Note (Signed)
Chronic condition with previous control. No checking BG routinely. Concerns present over significant restriction of carbohydrates from diet. Discussion with patient on importance of carbohydrates and protein in diet to ensure overall health and blood sugar control. Avoid skipping meals, but rather eat multiple small meals throughout the day with goal to keep blood sugar in a steady state and avoid highs and lows. We will plan to trial this method for three months and follow-up with labs to ensure good control. Continue current medications. Freestyle libre ordered to monitor blood sugars and effect that specific foods have on control.

## 2021-10-10 ENCOUNTER — Other Ambulatory Visit (HOSPITAL_BASED_OUTPATIENT_CLINIC_OR_DEPARTMENT_OTHER): Payer: Self-pay

## 2021-10-14 ENCOUNTER — Other Ambulatory Visit: Payer: Self-pay | Admitting: Nurse Practitioner

## 2021-10-14 DIAGNOSIS — Z09 Encounter for follow-up examination after completed treatment for conditions other than malignant neoplasm: Secondary | ICD-10-CM

## 2021-10-20 ENCOUNTER — Ambulatory Visit (INDEPENDENT_AMBULATORY_CARE_PROVIDER_SITE_OTHER): Admitting: Nurse Practitioner

## 2021-10-20 DIAGNOSIS — Z0000 Encounter for general adult medical examination without abnormal findings: Secondary | ICD-10-CM | POA: Diagnosis not present

## 2021-10-20 DIAGNOSIS — B80 Enterobiasis: Secondary | ICD-10-CM | POA: Insufficient documentation

## 2021-10-20 DIAGNOSIS — E119 Type 2 diabetes mellitus without complications: Secondary | ICD-10-CM

## 2021-10-20 NOTE — Assessment & Plan Note (Addendum)
Chronic. No recent labs- will obtain today.  Recommend daily fasting AM blood sugar monitoring  Continue metformin Sample glucometer provided today. Sample Freestyle Libre2 placed and education provided- awaiting approval for CGM Goals:  AM fasting blood sugar 70-110 2 hours after meals blood sugar <150 150-180 grams of carbohydrates plus 90g carbohydrates per day Exercise at least 20 minutes per day- work towards goal of 150 min a week Blood pressure <130/80 Labs 3 months  Follow-Up: 3 months

## 2021-10-20 NOTE — Progress Notes (Signed)
Patient scheduled for annual physical exam at a later time. We will plan for labs only today.   Nurse applied CGM and assisted patient in application set up. Discussion on use of the CGM and equipment provided.

## 2021-10-20 NOTE — Assessment & Plan Note (Addendum)
Lab for future CPE. No concerns.

## 2021-10-21 LAB — COMPREHENSIVE METABOLIC PANEL
ALT: 25 IU/L (ref 0–32)
AST: 18 IU/L (ref 0–40)
Albumin/Globulin Ratio: 1.6 (ref 1.2–2.2)
Albumin: 4.2 g/dL (ref 3.9–4.9)
Alkaline Phosphatase: 91 IU/L (ref 44–121)
BUN/Creatinine Ratio: 12 (ref 9–23)
BUN: 9 mg/dL (ref 6–24)
Bilirubin Total: 0.6 mg/dL (ref 0.0–1.2)
CO2: 21 mmol/L (ref 20–29)
Calcium: 9.3 mg/dL (ref 8.7–10.2)
Chloride: 104 mmol/L (ref 96–106)
Creatinine, Ser: 0.77 mg/dL (ref 0.57–1.00)
Globulin, Total: 2.6 g/dL (ref 1.5–4.5)
Glucose: 182 mg/dL — ABNORMAL HIGH (ref 70–99)
Potassium: 4.7 mmol/L (ref 3.5–5.2)
Sodium: 139 mmol/L (ref 134–144)
Total Protein: 6.8 g/dL (ref 6.0–8.5)
eGFR: 94 mL/min/{1.73_m2} (ref >59)

## 2021-10-21 LAB — CBC WITH DIFFERENTIAL/PLATELET
Basophils Absolute: 0.1 10*3/uL (ref 0.0–0.2)
Basos: 1 %
EOS (ABSOLUTE): 0.2 10*3/uL (ref 0.0–0.4)
Eos: 3 %
Hematocrit: 37.4 % (ref 34.0–46.6)
Hemoglobin: 12 g/dL (ref 11.1–15.9)
Immature Grans (Abs): 0 10*3/uL (ref 0.0–0.1)
Immature Granulocytes: 0 %
Lymphocytes Absolute: 2 10*3/uL (ref 0.7–3.1)
Lymphs: 32 %
MCH: 27 pg (ref 26.6–33.0)
MCHC: 32.1 g/dL (ref 31.5–35.7)
MCV: 84 fL (ref 79–97)
Monocytes Absolute: 0.5 10*3/uL (ref 0.1–0.9)
Monocytes: 8 %
Neutrophils Absolute: 3.5 10*3/uL (ref 1.4–7.0)
Neutrophils: 56 %
Platelets: 387 10*3/uL (ref 150–450)
RBC: 4.44 x10E6/uL (ref 3.77–5.28)
RDW: 14.6 % (ref 11.7–15.4)
WBC: 6.3 10*3/uL (ref 3.4–10.8)

## 2021-10-21 LAB — LIPID PANEL
Chol/HDL Ratio: 4.2 ratio (ref 0.0–4.4)
Cholesterol, Total: 199 mg/dL (ref 100–199)
HDL: 47 mg/dL (ref >39)
LDL Chol Calc (NIH): 131 mg/dL — ABNORMAL HIGH (ref 0–99)
Triglycerides: 114 mg/dL (ref 0–149)
VLDL Cholesterol Cal: 21 mg/dL (ref 5–40)

## 2021-10-21 LAB — HEMOGLOBIN A1C
Est. average glucose Bld gHb Est-mCnc: 154 mg/dL
Hgb A1c MFr Bld: 7 % — ABNORMAL HIGH (ref 4.8–5.6)

## 2021-10-21 LAB — TSH: TSH: 1.1 u[IU]/mL (ref 0.450–4.500)

## 2021-10-21 LAB — VITAMIN D 25 HYDROXY (VIT D DEFICIENCY, FRACTURES): Vit D, 25-Hydroxy: 23.2 ng/mL — ABNORMAL LOW (ref 30.0–100.0)

## 2021-10-27 ENCOUNTER — Encounter (HOSPITAL_BASED_OUTPATIENT_CLINIC_OR_DEPARTMENT_OTHER): Payer: Self-pay | Admitting: Nurse Practitioner

## 2021-11-01 ENCOUNTER — Encounter (HOSPITAL_BASED_OUTPATIENT_CLINIC_OR_DEPARTMENT_OTHER): Payer: Self-pay

## 2021-11-18 ENCOUNTER — Ambulatory Visit
Admission: RE | Admit: 2021-11-18 | Discharge: 2021-11-18 | Disposition: A | Discharge status: Home / Self Care | Source: Ambulatory Visit | Attending: Nurse Practitioner | Admitting: Nurse Practitioner

## 2021-11-18 DIAGNOSIS — Z09 Encounter for follow-up examination after completed treatment for conditions other than malignant neoplasm: Secondary | ICD-10-CM

## 2021-12-01 ENCOUNTER — Ambulatory Visit (HOSPITAL_BASED_OUTPATIENT_CLINIC_OR_DEPARTMENT_OTHER): Admitting: Nurse Practitioner

## 2022-01-30 ENCOUNTER — Encounter (HOSPITAL_BASED_OUTPATIENT_CLINIC_OR_DEPARTMENT_OTHER): Payer: Self-pay

## 2022-01-30 ENCOUNTER — Ambulatory Visit (HOSPITAL_BASED_OUTPATIENT_CLINIC_OR_DEPARTMENT_OTHER): Admitting: Family Medicine

## 2022-02-01 ENCOUNTER — Other Ambulatory Visit: Payer: Self-pay

## 2022-02-01 ENCOUNTER — Ambulatory Visit (INDEPENDENT_AMBULATORY_CARE_PROVIDER_SITE_OTHER): Admitting: Surgery

## 2022-02-01 ENCOUNTER — Encounter: Payer: Self-pay | Admitting: Surgery

## 2022-02-01 VITALS — BP 124/84 | HR 66 | Temp 98.0°F | Ht 66.0 in | Wt 208.0 lb

## 2022-02-01 DIAGNOSIS — L723 Sebaceous cyst: Secondary | ICD-10-CM

## 2022-02-01 NOTE — Patient Instructions (Signed)
Please see your follow up appointment listed below.  °

## 2022-02-03 ENCOUNTER — Encounter: Payer: Self-pay | Admitting: Surgery

## 2022-02-03 NOTE — Progress Notes (Signed)
02/01/2022  Reason for Visit:  Sebaceous cysts of the scalp  Requesting Provider:  Jacolyn Reedy, NP  History of Present Illness: Rebecca Hernandez is a 52 y.o. female presenting for evaluation of multiple sebaceous cysts in her scalp.  She's had prior cysts in the past that were drained or excised.  She has three in her scalp that she reports today.  One that is towards the top of the scalp which is the most tender for her.  She denies any drainage from it, but report that it is tender to touch, and she has decreased the frequency of washing her hair due to the discomfort.  She has two smaller ones in the back of the scalp which are starting to affect her more when she lies down in bed, as the pillow hits the cysts.  Denies any current drainage, fevers, chills, or redness of the scalp.  Past Medical History: Past Medical History:  Diagnosis Date   Bell's palsy    Less than 47 years old    Diabetes ([place])    Eczema    GERD (gastroesophageal reflux disease)    Left-sided face pain    Migraine headache      Past Surgical History: Past Surgical History:  Procedure Laterality Date   BIOPSY N/A 03/27/2016   Procedure: BIOPSY small bowel;  Surgeon: Danie Binder, MD;  [facility];  Service: Endoscopy;  Laterality: N/A;  small bowel biopsy   CHOLECYSTECTOMY N/A last year sept. 2015   COLONOSCOPY N/A 03/27/2016   Procedure: COLONOSCOPY;  Surgeon: Danie Binder, MD;  [facility];  Service: Endoscopy;  Laterality: N/A;  8:30am   ESOPHAGOGASTRODUODENOSCOPY N/A 03/27/2016   Procedure: ESOPHAGOGASTRODUODENOSCOPY (EGD);  Surgeon: Danie Binder, MD;  [facility];  Service: Endoscopy;  Laterality: N/A;   LAPAROSCOPIC ENDOMETRIOSIS FULGURATION      Home Medications: Prior to Admission medications   Medication Sig Start Date End Date Taking? Authorizing Provider  Cholecalciferol (VITAMIN D3) 1.25 MG (50000 UT) CAPS Take 1 capsule (50,000 units) by mouth weekly  09/20/21  Yes Early, Coralee Pesa, NP  Continuous Blood Gluc Sensor (FREESTYLE LIBRE 2 SENSOR) MISC Attach one sensor to the arm every 14 days. 08/31/21  Yes Early, Coralee Pesa, NP  metFORMIN (GLUCOPHAGE-XR) 750 MG 24 hr tablet Take 2 tablets (1,500 mg) by mouth daily with evening meal 08/25/21  Yes     Allergies: Allergies  Allergen Reactions   Dilaudid [Hydromorphone Hcl] Shortness Of Breath    Disoriented. Burning sensation constant.    Social History:  reports that she has never smoked. She has never used smokeless tobacco. She reports current alcohol use of about 1.0 standard drink of alcohol per week. She reports that she does not use drugs.   Family History: Family History  Problem Relation Age of Onset   Healthy Mother    Healthy Father    Heart attack Maternal Grandmother    Diabetes Maternal Uncle    Allergic rhinitis Neg Hx    Angioedema Neg Hx    Asthma Neg Hx    Atopy Neg Hx    Eczema Neg Hx    Immunodeficiency Neg Hx    Urticaria Neg Hx    Celiac disease Neg Hx    Colon cancer Neg Hx     Review of Systems: Review of Systems  Constitutional:  Negative for chills and fever.  Respiratory:  Negative for shortness of breath.   Cardiovascular:  Negative for chest  pain.  Gastrointestinal:  Negative for nausea and vomiting.  Skin:        Three scalp cysts, tender.    Physical Exam BP 124/84   Pulse 66   Temp 98 F (36.7 C) (Oral)   Ht '5\' 6"'$  (1.676 m)   Wt 208 lb (94.3 kg)   SpO2 99%   BMI 33.57 kg/m  CONSTITUTIONAL: No acute distress, well nourished. HEENT:  Normocephalic, atraumatic, extraocular motion intact. RESPIRATORY:  Normal respiratory effort without pathologic use of accessory muscles. CARDIOVASCULAR: Regular rhythm and rate. MUSCULOSKELETAL:  Normal muscle strength and tone in all four extremities.  No peripheral edema or cyanosis. SKIN: The scalp has three cysts that are palpable.  At the top of the scalp, there is a 2 cm cyst that is soft, mobile, with  some mild irritation likely from the distention/pressure.  No active drainage, no induration or true erythema.  She has two smaller cysts in the back of the scalp which are about 1 cm in size, both also mobile and soft, without any drainage or erythema.  NEUROLOGIC:  Motor and sensation is grossly normal.  Cranial nerves are grossly intact. PSYCH:  Alert and oriented to person, place and time. Affect is normal.  Laboratory Analysis: No results found for this or any previous visit (from the past 24 hour(s)).  Imaging: No results found.  Assessment and Plan: This is a 52 y.o. female with sebaceous cysts of the scalp  --Discussed with her that these are in fact sebaceous cysts.  These are currently not infected and no I&D procedure is needed right now.  However, due to her symptoms, it is reasonable to proceed with excision of them.  Discussed with her that these excisions can be done in the office as in-office procedures under local anesthesia, or could be done in the OR if she preferred.  She's ok with office procedure.  Discussed with her the stepwise approach and deal with the most tender cyst first and then take the other two smaller ones.  She's in agreement.  Discussed risks of bleeding, infection, injuyr to surrounding structures, and she's willing to proceed. --Will schedule her for in-office excision of the top of the scalp cyst first. --All of her questions have been answered.  I spent 30 minutes dedicated to the care of this patient on the date of this encounter to include pre-visit review of records, face-to-face time with the patient discussing diagnosis and management, and any post-visit coordination of care.   Melvyn Neth, [facility]

## 2022-02-06 ENCOUNTER — Encounter (HOSPITAL_BASED_OUTPATIENT_CLINIC_OR_DEPARTMENT_OTHER): Payer: Self-pay

## 2022-02-06 ENCOUNTER — Ambulatory Visit (HOSPITAL_BASED_OUTPATIENT_CLINIC_OR_DEPARTMENT_OTHER): Admitting: Family Medicine

## 2022-02-08 ENCOUNTER — Ambulatory Visit (INDEPENDENT_AMBULATORY_CARE_PROVIDER_SITE_OTHER): Admitting: Surgery

## 2022-02-08 ENCOUNTER — Other Ambulatory Visit: Payer: Self-pay | Admitting: Surgery

## 2022-02-08 ENCOUNTER — Other Ambulatory Visit: Payer: Self-pay

## 2022-02-08 ENCOUNTER — Encounter: Payer: Self-pay | Admitting: Surgery

## 2022-02-08 VITALS — BP 128/84 | HR 83 | Temp 98.2°F | Ht 66.0 in | Wt 203.0 lb

## 2022-02-08 DIAGNOSIS — L729 Follicular cyst of the skin and subcutaneous tissue, unspecified: Secondary | ICD-10-CM

## 2022-02-08 NOTE — Patient Instructions (Signed)
We have removed a Cyst in our office today.  You have sutures under the skin that will dissolve and also dermabond (skin glue) on top of your skin which will come off on it's own in 10-14 days.  You may use Ibuprofen or Tylenol as needed for pain control. Use the ice pack 3-4 times a day for the next two days for any achiness.  You may shower tomorrow, do not scrub at the area.  Avoid Strenuous activities that will make you sweat during the next 48 hours to avoid the glue coming off prematurely. Avoid activities that will place pressure to this area of the body for 1-2 weeks to avoid re-injury to incision site.  Please see your follow-up appointment provided. We will see you back in office to make sure this area is healed and to review the final pathology. If you have any questions or concerns prior to this appointment, call our office and speak with a nurse.    Excision of Skin Cysts or Lesions Excision of a skin lesion refers to the removal of a section of skin by making small cuts (incisions) in the skin. This procedure may be done to remove a cancerous (malignant) or noncancerous (benign) growth on the skin. It is typically done to treat or prevent cancer or infection. It may also be done to improve cosmetic appearance. The procedure may be done to remove: Cancerous growths, such as basal cell carcinoma, squamous cell carcinoma, or melanoma. Noncancerous growths, such as a cyst or lipoma. Growths, such as moles or skin tags, which may be removed for cosmetic reasons.  Various excision or surgical techniques may be used depending on your condition, the location of the lesion, and your overall health. Tell a health care provider about: Any allergies you have. All medicines you are taking, including vitamins, herbs, eye drops, creams, and over-the-counter medicines. Any problems you or family members have had with anesthetic medicines. Any blood disorders you have. Any surgeries you have  had. Any medical conditions you have. Whether you are pregnant or may be pregnant. What are the risks? Generally, this is a safe procedure. However, problems may occur, including: Bleeding. Infection. Scarring. Recurrence of the cyst, lipoma, or cancer. Changes in skin sensation or appearance, such as discoloration or swelling. Reaction to the anesthetics. Allergic reaction to surgical materials or ointments. Damage to nerves, blood vessels, muscles, or other structures. Continued pain.  What happens before the procedure? Ask your health care provider about: Changing or stopping your regular medicines. This is especially important if you are taking diabetes medicines or blood thinners. Taking medicines such as aspirin and ibuprofen. These medicines can thin your blood. Do not take these medicines before your procedure if your health care provider instructs you not to. You may be asked to take certain medicines. You may be asked to stop smoking. You may have an exam or testing. What happens during the procedure? To reduce your risk of infection: Your health care team will wash or sanitize their hands. Your skin will be washed with soap. You will be given a medicine to numb the area (local anesthetic). One of the following excision techniques will be performed. At the end of any of these procedures, antibiotic ointment will be applied as needed. Each of the following techniques may vary among health care providers and hospitals. Complete Surgical Excision The area of skin that needs to be removed will be marked with a pen. Using a small scalpel or scissors, the surgeon  will gently cut around and under the lesion until it is completely removed. The lesion will be placed in a fluid and sent to the lab for examination. If necessary, bleeding will be controlled with a device that delivers heat (electrocautery). The edges of the wound may be stitched (sutured) together, and a bandage  (dressing) will be applied. This procedure may be performed to treat a cancerous growth or a noncancerous cyst or lesion. Excision of a Cyst The surgeon will make an incision on the cyst. The entire cyst will be removed through the incision. The incision may be closed with sutures.  What happens after the procedure? Return to your normal activities as told by your health care provider.

## 2022-02-08 NOTE — Progress Notes (Signed)
  Procedure Date:  02/08/2022  Pre-operative Diagnosis:  Scalp cyst  Post-operative Diagnosis:  Scalp cyst, 1.8 cm.  Procedure:  Excision of 1.8 cm scalp cyst; layered closure of 2 cm incision.  Surgeon:  Melvyn Neth, MD  Anesthesia:  5 ml of 1% lidocaine with epi  Estimated Blood Loss:  5 ml  Specimens:  scalp cyst  Complications:  None  Indications for Procedure:  This is a 52 y.o. female with diagnosis of a symptomatic scalp cyst.  The patient wishes to have this excised. The risks of bleeding, abscess or infection, injury to surrounding structures, and need for further procedures were all discussed with the patient and she was willing to proceed.  Description of Procedure: The patient was correctly identified at bedside.  The patient was placed supine.  Appropriate time-outs were performed.  The patient's scalp was prepped and draped in usual sterile fashion.  Local anesthetic was infused intradermally.  An elliptical 2 cm incision was made over the cyst, and scalpel was used to dissect down the skin and subcutaneous tissue.  Skin flaps were created sharply, and then the cyst was excised.  It was sent off to pathology.  The cavity was then irrigated and hemostasis was assured with a 3-0 Vicryl suture at the base of the wound bed.  The wound was then closed in two layers using 3-0 Vicryl and 4-0 Monocryl.  The incision was cleaned and sealed with DermaBond.  The patient tolerated the procedure well and all sharps were appropriately disposed of at the end of the case.  --Patient may take Tylenol or ibuprofen for pain control. --May shower tomorrow, but do not scrub wound heavily --Follow up in two weeks for wound check and to excise two additional scalp cysts.  Melvyn Neth, MD

## 2022-02-12 ENCOUNTER — Other Ambulatory Visit (HOSPITAL_BASED_OUTPATIENT_CLINIC_OR_DEPARTMENT_OTHER): Payer: Self-pay

## 2022-02-13 ENCOUNTER — Other Ambulatory Visit (HOSPITAL_BASED_OUTPATIENT_CLINIC_OR_DEPARTMENT_OTHER): Payer: Self-pay

## 2022-02-14 ENCOUNTER — Encounter (HOSPITAL_BASED_OUTPATIENT_CLINIC_OR_DEPARTMENT_OTHER): Payer: Self-pay | Admitting: Family Medicine

## 2022-02-14 ENCOUNTER — Other Ambulatory Visit (HOSPITAL_BASED_OUTPATIENT_CLINIC_OR_DEPARTMENT_OTHER): Payer: Self-pay

## 2022-02-14 ENCOUNTER — Ambulatory Visit (INDEPENDENT_AMBULATORY_CARE_PROVIDER_SITE_OTHER): Admitting: Family Medicine

## 2022-02-14 VITALS — BP 125/81 | HR 84 | Ht 66.0 in | Wt 205.9 lb

## 2022-02-14 DIAGNOSIS — E119 Type 2 diabetes mellitus without complications: Secondary | ICD-10-CM

## 2022-02-14 NOTE — Assessment & Plan Note (Addendum)
Reports taking metformin as prescribed.  Denies chest pain, shortness of breath, vision changes.  Endorses increased thirst minimal increased urination.  Last A1c done October 20, 2021 7.0, will check today.  Reports that she checks her blood sugars at home and reports numbers in the low 200s.  Is awake about 6 hours before she starts to eat. Recommend small protein meal earlier in the day to see how blood sugars respond. Offered dietician referral, declines.  She will continue metformin XL 750 mg daily while awaiting lab results.  Medication changes may be needed at that time.

## 2022-02-14 NOTE — Progress Notes (Signed)
Established Patient Office Visit  Subjective   Patient ID: Rebecca Hernandez, female    DOB: April 26, 1970  Age: 52 y.o. MRN: [id number]  Chief Complaint  Patient presents with   Diabetes Management Plan    Pt here to discuss diabetes    HPI Diabetes: Medication compliance: metformin 750 mg QD Denies chest pain, shortness of breath, vision changes. Endorses  polydipsia, some polyuria. "I feel like my mouth has cotton balls in it"  Pertinent lab work: A1C: last A1C 10/20/21 7.0 Monitoring: blood sugar readings at home: fasting at 1200 (been awake for 6 hours) around 200-215, 2 hours after meal: same numbers. Before bedtime 2 hours after eating: around 200's           Continue current medication regimen: will continue metformin today, awaiting on lab results.   Well controlled: will get A1C today to assess control   Follow-up: 3 months     Review of Systems  Respiratory:  Negative for shortness of breath.   Cardiovascular:  Negative for chest pain.  Endo/Heme/Allergies:  Positive for polydipsia.       Feeling increased thirst, not urinating much more.       Objective:     BP 125/81 (BP Location: Left Arm, Patient Position: Sitting, Cuff Size: Large)   Pulse 84   Ht '5\' 6"'$  (1.676 m)   Wt 205 lb 14.4 oz (93.4 kg)   SpO2 100%   BMI 33.23 kg/m    Physical Exam Vitals and nursing note reviewed.  Constitutional:      General: She is not in acute distress.    Appearance: Normal appearance.  Cardiovascular:     Rate and Rhythm: Normal rate and regular rhythm.     Heart sounds: Normal heart sounds.  Pulmonary:     Effort: Pulmonary effort is normal.     Breath sounds: Normal breath sounds.  Skin:    General: Skin is warm and dry.  Neurological:     General: No focal deficit present.     Mental Status: She is alert. Mental status is at baseline.  Psychiatric:        Mood and Affect: Mood normal.        Behavior: Behavior normal.      No results found for any  visits on 02/14/22.  Last hemoglobin A1c Lab Results  Component Value Date   HGBA1C 7.0 (H) 10/20/2021      The 10-year ASCVD risk score (Arnett DK, et al., 2019) is: 2.8%    Assessment & Plan:   Problem List Items Addressed This Visit     Diabetes mellitus without complication ([place]) - Primary    Reports taking metformin as prescribed.  Denies chest pain, shortness of breath, vision changes.  Endorses increased thirst minimal increased urination.  Last A1c done October 20, 2021 7.0, will check today.  Reports that she checks her blood sugars at home and reports numbers in the low 200s.  Is awake about 6 hours before she starts to eat. Recommend small protein meal earlier in the day to see how blood sugars respond. Offered dietician referral, declines.  She will continue metformin XL 750 mg daily while awaiting lab results.  Medication changes may be needed at that time.      Relevant Orders   Comprehensive metabolic panel   Hemoglobin A1c  Agrees with plan of care discussed.  Questions answered. Will notify once lab results are available for possible changes.   Return in about 3  months (around 05/16/2022) for DM .    Chalmers Guest, FNP

## 2022-02-14 NOTE — Patient Instructions (Addendum)
Try to eat sooner in the day to see if this helps your blood sugar.  We will notify you when labs are back for next steps.

## 2022-02-15 ENCOUNTER — Other Ambulatory Visit (HOSPITAL_BASED_OUTPATIENT_CLINIC_OR_DEPARTMENT_OTHER): Payer: Self-pay

## 2022-02-15 ENCOUNTER — Encounter: Payer: Self-pay | Admitting: Family Medicine

## 2022-02-15 ENCOUNTER — Encounter: Admitting: Surgery

## 2022-02-15 ENCOUNTER — Other Ambulatory Visit: Payer: Self-pay | Admitting: Family Medicine

## 2022-02-15 DIAGNOSIS — E119 Type 2 diabetes mellitus without complications: Secondary | ICD-10-CM

## 2022-02-15 LAB — COMPREHENSIVE METABOLIC PANEL
ALT: 31 IU/L (ref 0–32)
AST: 23 IU/L (ref 0–40)
Albumin/Globulin Ratio: 1.6 (ref 1.2–2.2)
Albumin: 4.3 g/dL (ref 3.9–4.9)
Alkaline Phosphatase: 106 IU/L (ref 44–121)
BUN/Creatinine Ratio: 15 (ref 9–23)
BUN: 11 mg/dL (ref 6–24)
Bilirubin Total: 0.5 mg/dL (ref 0.0–1.2)
CO2: 23 mmol/L (ref 20–29)
Calcium: 9.3 mg/dL (ref 8.7–10.2)
Chloride: 100 mmol/L (ref 96–106)
Creatinine, Ser: 0.71 mg/dL (ref 0.57–1.00)
Globulin, Total: 2.7 g/dL (ref 1.5–4.5)
Glucose: 235 mg/dL — ABNORMAL HIGH (ref 70–99)
Potassium: 4 mmol/L (ref 3.5–5.2)
Sodium: 135 mmol/L (ref 134–144)
Total Protein: 7 g/dL (ref 6.0–8.5)
eGFR: 104 mL/min/{1.73_m2} (ref >59)

## 2022-02-15 LAB — HEMOGLOBIN A1C
Est. average glucose Bld gHb Est-mCnc: 272 mg/dL
Hgb A1c MFr Bld: 11.1 % — ABNORMAL HIGH (ref 4.8–5.6)

## 2022-02-15 MED ORDER — EMPAGLIFLOZIN 10 MG PO TABS
10.0000 mg | ORAL_TABLET | Freq: Every day | ORAL | 1 refills | Status: DC
  Filled 2022-02-15 – 2022-04-05 (×2): qty 30, 30d supply, fill #0

## 2022-02-20 ENCOUNTER — Ambulatory Visit: Admitting: Surgery

## 2022-02-28 ENCOUNTER — Ambulatory Visit (HOSPITAL_BASED_OUTPATIENT_CLINIC_OR_DEPARTMENT_OTHER): Admitting: Family Medicine

## 2022-04-05 ENCOUNTER — Other Ambulatory Visit (HOSPITAL_BASED_OUTPATIENT_CLINIC_OR_DEPARTMENT_OTHER): Payer: Self-pay

## 2022-04-06 ENCOUNTER — Other Ambulatory Visit: Payer: Self-pay

## 2022-04-10 ENCOUNTER — Other Ambulatory Visit (HOSPITAL_BASED_OUTPATIENT_CLINIC_OR_DEPARTMENT_OTHER): Payer: Self-pay

## 2022-05-16 ENCOUNTER — Ambulatory Visit (HOSPITAL_BASED_OUTPATIENT_CLINIC_OR_DEPARTMENT_OTHER): Admitting: Family Medicine

## 2022-07-12 ENCOUNTER — Other Ambulatory Visit: Payer: Self-pay

## 2022-07-12 ENCOUNTER — Other Ambulatory Visit (INDEPENDENT_AMBULATORY_CARE_PROVIDER_SITE_OTHER)

## 2022-07-12 DIAGNOSIS — E119 Type 2 diabetes mellitus without complications: Secondary | ICD-10-CM

## 2022-07-12 LAB — COMPREHENSIVE METABOLIC PANEL
ALT: 20 U/L (ref 0–35)
AST: 15 U/L (ref 0–37)
Albumin: 4 g/dL (ref 3.5–5.2)
Alkaline Phosphatase: 82 U/L (ref 39–117)
BUN: 13 mg/dL (ref 6–23)
CO2: 23 mEq/L (ref 19–32)
Calcium: 8.7 mg/dL (ref 8.4–10.5)
Chloride: 102 mEq/L (ref 96–112)
Creatinine, Ser: 0.61 mg/dL (ref 0.40–1.20)
GFR: 103.82 mL/min (ref >60.00)
Glucose, Bld: 287 mg/dL — ABNORMAL HIGH (ref 70–99)
Potassium: 4.2 mEq/L (ref 3.5–5.1)
Sodium: 133 mEq/L — ABNORMAL LOW (ref 135–145)
Total Bilirubin: 1.1 mg/dL (ref 0.2–1.2)
Total Protein: 6.8 g/dL (ref 6.0–8.3)

## 2022-07-12 LAB — LIPID PANEL
Cholesterol: 177 mg/dL (ref 0–200)
HDL: 51.9 mg/dL (ref >39.00)
LDL Cholesterol: 111 mg/dL — ABNORMAL HIGH (ref 0–99)
NonHDL: 125.43
Total CHOL/HDL Ratio: 3
Triglycerides: 72 mg/dL (ref 0.0–149.0)
VLDL: 14.4 mg/dL (ref 0.0–40.0)

## 2022-07-12 LAB — MICROALBUMIN / CREATININE URINE RATIO
Creatinine,U: 107.5 mg/dL
Microalb Creat Ratio: 0.7 mg/g (ref 0.0–30.0)
Microalb, Ur: <0.7 mg/dL (ref 0.0–1.9)

## 2022-07-12 LAB — HEMOGLOBIN A1C: Hgb A1c MFr Bld: 9.1 % — ABNORMAL HIGH (ref 4.6–6.5)

## 2022-07-17 ENCOUNTER — Encounter: Payer: Self-pay | Admitting: "Endocrinology

## 2022-07-17 ENCOUNTER — Ambulatory Visit: Admitting: "Endocrinology

## 2022-07-17 VITALS — BP 115/70 | HR 84 | Ht 66.0 in | Wt 198.6 lb

## 2022-07-17 DIAGNOSIS — E1165 Type 2 diabetes mellitus with hyperglycemia: Secondary | ICD-10-CM | POA: Diagnosis not present

## 2022-07-17 DIAGNOSIS — E7800 Pure hypercholesterolemia, unspecified: Secondary | ICD-10-CM | POA: Diagnosis not present

## 2022-07-17 DIAGNOSIS — Z7984 Long term (current) use of oral hypoglycemic drugs: Secondary | ICD-10-CM

## 2022-07-17 MED ORDER — EMPAGLIFLOZIN 10 MG PO TABS: 10.0000 mg | ORAL_TABLET | Freq: Every day | ORAL | 2 refills | Status: DC

## 2022-07-17 MED ORDER — METFORMIN HCL 500 MG PO TABS: 1000.0000 mg | ORAL_TABLET | Freq: Two times a day (BID) | ORAL | 2 refills | Status: DC

## 2022-07-17 MED ORDER — TRULICITY 0.75 MG/0.5ML ~~LOC~~ SOAJ: 0.7500 mg | SUBCUTANEOUS | 0 refills | Status: DC

## 2022-07-17 MED ORDER — DEXCOM G7 SENSOR MISC: 1.0000 | 0 refills | Status: DC

## 2022-07-17 NOTE — Patient Instructions (Signed)

## 2022-07-17 NOTE — Progress Notes (Signed)
**Note Rebecca-Identified via Obfuscation** Outpatient Endocrinology Note Rebecca Christiana, MD  07/17/22   Rebecca Hernandez 1970-03-11 [id number]  Referring Provider: Novella Olive, FNP Primary Care Provider: [place], Buren Kos, MD Reason for consultation: Subjective   Assessment & Plan  [name] was seen today for diabetes.  Diagnoses and all orders for this visit:  Uncontrolled type 2 diabetes mellitus with hyperglycemia (HCC)  Long term (current) use of oral hypoglycemic drugs  Pure hypercholesterolemia  Other orders -     Continuous Glucose Sensor (DEXCOM G7 SENSOR) MISC; 1 Device by Does not apply route continuous. -     Dulaglutide (TRULICITY) 0.75 MG/0.5ML SOPN; Inject 0.75 mg into the skin once a week. -     metFORMIN (GLUCOPHAGE) 500 MG tablet; Take 2 tablets (1,000 mg total) by mouth 2 (two) times daily with a meal. -     empagliflozin (JARDIANCE) 10 MG TABS tablet; Take 1 tablet (10 mg total) by mouth daily before breakfast.    Diabetes Type II complicated by neuropathy  Lab Results  Component Value Date   GFR 103.82 07/12/2022   Hba1c goal less than 7, current Hba1c is  Lab Results  Component Value Date   HGBA1C 9.1 (H) 07/12/2022   Will recommend the following: Metformin 500 mg 2 pills bid Jardiance 10 mg every day Trucility 0.75 mg weekly Ordered DexCom   No known contraindications to any of above medications  -Last LD and Tg are as follows: Lab Results  Component Value Date   LDLCALC 111 (H) 07/12/2022    Lab Results  Component Value Date   TRIG 72.0 07/12/2022   -Will start statin once settles on diabetes medication  -Follow low fat diet and exercise   -Blood pressure goal <140/90 - Microalbumin/creatinine goal < 30 -Last MA/Cr is as follows: Lab Results  Component Value Date   MICROALBUR <0.7 07/12/2022   -not on ACE/ARB  -diet changes including salt restriction -limit eating outside -counseled BP targets per standards of diabetes care -uncontrolled blood  pressure can lead to retinopathy, nephropathy and cardiovascular and atherosclerotic heart disease  Reviewed and counseled on: -A1C target -Blood sugar targets -Complications of uncontrolled diabetes  -Checking blood sugar before meals and bedtime and bring log next visit -All medications with mechanism of action and side effects -Hypoglycemia management: rule of 15's, Glucagon Emergency Kit and medical alert ID -low-carb low-fat plate-method diet -At least 20 minutes of physical activity per day -Annual dilated retinal eye exam and foot exam -compliance and follow up needs -follow up as scheduled or earlier if problem gets worse  Call if blood sugar is less than 70 or consistently above 250    Take a 15 gm snack of carbohydrate at bedtime before you go to sleep if your blood sugar is less than 100.    If you are going to fast after midnight for a test or procedure, ask your physician for instructions on how to reduce/decrease your insulin dose.    Call if blood sugar is less than 70 or consistently above 250  -Treating a low sugar by rule of 15  (15 gms of sugar every 15 min until sugar is more than 70) If you feel your sugar is low, test your sugar to be sure If your sugar is low (less than 70), then take 15 grams of a fast acting Carbohydrate (3-4 glucose tablets or glucose gel or 4 ounces of juice or regular soda) Recheck your sugar 15 min after treating low to make sure  it is more than 70 If sugar is still less than 70, treat again with 15 grams of carbohydrate          Don't drive the hour of hypoglycemia  If unconscious/unable to eat or drink by mouth, use glucagon injection or nasal spray baqsimi and call 911. Can repeat again in 15 min if still unconscious.  Return in about 3 months (around 10/26/2022).   I have reviewed current medications, nurse's notes, allergies, vital signs, past medical and surgical history, family medical history, and social history for this  encounter. Counseled patient on symptoms, examination findings, lab findings, imaging results, treatment decisions and monitoring and prognosis. The patient understood the recommendations and agrees with the treatment plan. All questions regarding treatment plan were fully answered.  Rebecca [place], MD  07/17/22    History of Present Illness Rebecca Hernandez is a 52 y.o. year old female who presents for evaluation of Type II diabetes mellitus.  Ludy Hefty was first diagnosed in 2023.   Diabetes education +  Home diabetes regimen: metFORMIN (GLUCOPHAGE-XR) 750 MG 24 hr tablet 2 tablets (1,500 mg) by mouth daily with evening meal   COMPLICATIONS -  MI/Stroke -  retinopathy + neuropathy -  nephropathy  SYMPTOMS REVIEWED - Polyuria - Weight loss + Blurred vision  BLOOD SUGAR DATA Checks BG 0-1/day Pre-prepandial: 214, runs in 200s  Physical Exam  BP 115/70   Pulse 84   Ht 5\' 6"  (1.676 m)   Wt 198 lb 9.6 oz (90.1 kg)   SpO2 97%   BMI 32.05 kg/m    Constitutional: well developed, well nourished Head: normocephalic, atraumatic Eyes: sclera anicteric, no redness Neck: supple Lungs: normal respiratory effort Neurology: alert and oriented Skin: dry, no appreciable rashes Musculoskeletal: no appreciable defects Psychiatric: normal mood and affect Diabetic Foot Exam - Simple   No data filed      Current Medications Patient's Medications  New Prescriptions   CONTINUOUS GLUCOSE SENSOR (DEXCOM G7 SENSOR) MISC    1 Device by Does not apply route continuous.   DULAGLUTIDE (TRULICITY) 0.75 MG/0.5ML SOPN    Inject 0.75 mg into the skin once a week.   EMPAGLIFLOZIN (JARDIANCE) 10 MG TABS TABLET    Take 1 tablet (10 mg total) by mouth daily before breakfast.   METFORMIN (GLUCOPHAGE) 500 MG TABLET    Take 2 tablets (1,000 mg total) by mouth 2 (two) times daily with a meal.  Previous Medications   CHOLECALCIFEROL (VITAMIN D3) 1.25 MG (50000 UT) CAPS    Take 1  capsule (50,000 units) by mouth weekly  Modified Medications   No medications on file  Discontinued Medications   CONTINUOUS BLOOD GLUC SENSOR (FREESTYLE LIBRE 2 SENSOR) MISC    Attach one sensor to the arm every 14 days.   EMPAGLIFLOZIN (JARDIANCE) 10 MG TABS TABLET    Take 1 tablet (10 mg total) by mouth daily before breakfast.   METFORMIN (GLUCOPHAGE-XR) 750 MG 24 HR TABLET    Take 2 tablets (1,500 mg) by mouth daily with evening meal    Allergies Allergies  Allergen Reactions   Dilaudid [Hydromorphone Hcl] Shortness Of Breath    Disoriented. Burning sensation constant.    Past Medical History Past Medical History:  Diagnosis Date   Bell's palsy    Less than 75 years old    Diabetes (HCC)    Eczema    GERD (gastroesophageal reflux disease)    Left-sided face pain    Migraine headache  Past Surgical History Past Surgical History:  Procedure Laterality Date   BIOPSY N/A 03/27/2016   Procedure: BIOPSY small bowel;  Surgeon: West Bali, MD;  Location: [facility];  Service: Endoscopy;  Laterality: N/A;  small bowel biopsy   CHOLECYSTECTOMY N/A last year sept. 2015   COLONOSCOPY N/A 03/27/2016   Procedure: COLONOSCOPY;  Surgeon: West Bali, MD;  Location: [facility];  Service: Endoscopy;  Laterality: N/A;  8:30am   ESOPHAGOGASTRODUODENOSCOPY N/A 03/27/2016   Procedure: ESOPHAGOGASTRODUODENOSCOPY (EGD);  Surgeon: West Bali, MD;  Location: [facility];  Service: Endoscopy;  Laterality: N/A;   LAPAROSCOPIC ENDOMETRIOSIS FULGURATION      Family History family history includes Diabetes in her maternal uncle; Healthy in her father and mother; Heart attack in her maternal grandmother.  Social History Social History   Socioeconomic History   Marital status: Married    Spouse name: Not on file   Number of children: 0   Years of education: Not on file   Highest education level: Not on file  Occupational History   Occupation: Technical sales engineer of [employer]  Tobacco  Use   Smoking status: Never   Smokeless tobacco: Never   Tobacco comments:    Never smoked  Substance and Sexual Activity   Alcohol use: Yes    Alcohol/week: 1.0 standard drink of alcohol    Types: 1 Glasses of wine per week    Comment: rare   Drug use: No   Sexual activity: Not on file  Other Topics Concern   Not on file  Social History Narrative   She is from [place]. Husband is in the [employer].    Right handed   Lives at home with husband    Caffeine~ 1 cup per day    Social Determinants of Health   Financial Resource Strain: Not on file  Food Insecurity: Not on file  Transportation Needs: Not on file  Physical Activity: Not on file  Stress: Not on file  Social Connections: Not on file  Intimate Partner Violence: Not on file    Lab Results  Component Value Date   HGBA1C 9.1 (H) 07/12/2022   HGBA1C 11.1 (H) 02/14/2022   HGBA1C 7.0 (H) 10/20/2021   Lab Results  Component Value Date   CHOL 177 07/12/2022   Lab Results  Component Value Date   HDL 51.90 07/12/2022   Lab Results  Component Value Date   LDLCALC 111 (H) 07/12/2022   Lab Results  Component Value Date   TRIG 72.0 07/12/2022   Lab Results  Component Value Date   CHOLHDL 3 07/12/2022   Lab Results  Component Value Date   CREATININE 0.61 07/12/2022   Lab Results  Component Value Date   GFR 103.82 07/12/2022   Lab Results  Component Value Date   MICROALBUR <0.7 07/12/2022      Component Value Date/Time   NA 133 (L) 07/12/2022 0910   NA 135 02/14/2022 1431   K 4.2 07/12/2022 0910   CL 102 07/12/2022 0910   CO2 23 07/12/2022 0910   GLUCOSE 287 (H) 07/12/2022 0910   BUN 13 07/12/2022 0910   BUN 11 02/14/2022 1431   CREATININE 0.61 07/12/2022 0910   CALCIUM 8.7 07/12/2022 0910   PROT 6.8 07/12/2022 0910   PROT 7.0 02/14/2022 1431   ALBUMIN 4.0 07/12/2022 0910   ALBUMIN 4.3 02/14/2022 1431   AST 15 07/12/2022 0910   ALT 20 07/12/2022 0910   ALKPHOS 82 07/12/2022  0910    BILITOT 1.1 07/12/2022 0910   BILITOT 0.5 02/14/2022 1431   GFRNONAA 80 09/17/2018 0943   GFRAA 92 09/17/2018 0943      Latest Ref Rng & Units 07/12/2022    9:10 AM 02/14/2022    2:31 PM 10/20/2021    8:25 AM  BMP  Glucose 70 - 99 mg/dL 161  096  045   BUN 6 - 23 mg/dL 13  11  9    Creatinine 0.40 - 1.20 mg/dL 4.09  8.11  9.14   BUN/Creat Ratio 9 - 23  15  12    Sodium 135 - 145 mEq/L 133  135  139   Potassium 3.5 - 5.1 mEq/L 4.2  4.0  4.7   Chloride 96 - 112 mEq/L 102  100  104   CO2 19 - 32 mEq/L 23  23  21    Calcium 8.4 - 10.5 mg/dL 8.7  9.3  9.3        Component Value Date/Time   WBC 6.3 10/20/2021 0825   WBC 5.5 09/03/2013 1028   RBC 4.44 10/20/2021 0825   RBC 4.4 09/03/2013 1028   HGB 12.0 10/20/2021 0825   HCT 37.4 10/20/2021 0825   PLT 387 10/20/2021 0825   MCV 84 10/20/2021 0825   MCH 27.0 10/20/2021 0825   MCH 26.7 (A) 09/03/2013 1028   MCHC 32.1 10/20/2021 0825   MCHC 31.7 (A) 09/03/2013 1028   RDW 14.6 10/20/2021 0825   LYMPHSABS 2.0 10/20/2021 0825   EOSABS 0.2 10/20/2021 0825   BASOSABS 0.1 10/20/2021 0825     Parts of this note may have been dictated using voice recognition software. There may be variances in spelling and vocabulary which are unintentional. Not all errors are proofread. Please notify the Thereasa Parkin if any discrepancies are noted or if the meaning of any statement is not clear.

## 2022-07-21 ENCOUNTER — Other Ambulatory Visit (HOSPITAL_COMMUNITY): Payer: Self-pay

## 2022-07-21 ENCOUNTER — Telehealth: Payer: Self-pay

## 2022-07-21 NOTE — Telephone Encounter (Signed)
Patient Advocate Encounter   Received notification from [facility] that prior authorization is required for Dexcom G7 sensor  Submitted: 07/21/22 Key [id number]  Status is pending

## 2022-07-25 ENCOUNTER — Encounter: Payer: Self-pay | Admitting: "Endocrinology

## 2022-07-26 NOTE — Telephone Encounter (Signed)
Pharmacy Patient Advocate Encounter  Received notification that the request for prior authorization for Dexcom g7 sensor has been denied

## 2022-07-26 NOTE — Telephone Encounter (Signed)
Shakirra is aware the Dexcom G7 Sensor was denied

## 2022-08-12 ENCOUNTER — Encounter: Payer: Self-pay | Admitting: "Endocrinology

## 2022-08-12 ENCOUNTER — Other Ambulatory Visit: Payer: Self-pay | Admitting: "Endocrinology

## 2022-08-14 ENCOUNTER — Other Ambulatory Visit: Payer: Self-pay | Admitting: "Endocrinology

## 2022-08-14 ENCOUNTER — Other Ambulatory Visit (HOSPITAL_BASED_OUTPATIENT_CLINIC_OR_DEPARTMENT_OTHER): Payer: Self-pay | Admitting: Nurse Practitioner

## 2022-08-14 MED ORDER — TRULICITY 0.75 MG/0.5ML ~~LOC~~ SOAJ: 0.7500 mg | SUBCUTANEOUS | 0 refills | Status: DC

## 2022-08-15 ENCOUNTER — Other Ambulatory Visit (HOSPITAL_BASED_OUTPATIENT_CLINIC_OR_DEPARTMENT_OTHER): Payer: Self-pay

## 2022-08-15 ENCOUNTER — Other Ambulatory Visit: Payer: Self-pay

## 2022-08-15 DIAGNOSIS — Z794 Long term (current) use of insulin: Secondary | ICD-10-CM

## 2022-08-15 MED ORDER — TRULICITY 0.75 MG/0.5ML ~~LOC~~ SOAJ
0.7500 mg | SUBCUTANEOUS | 0 refills | Status: DC
Start: 2022-08-15 — End: 2022-09-11
  Filled 2022-08-15: qty 2, 28d supply, fill #0

## 2022-08-16 ENCOUNTER — Other Ambulatory Visit (HOSPITAL_BASED_OUTPATIENT_CLINIC_OR_DEPARTMENT_OTHER): Payer: Self-pay

## 2022-08-17 ENCOUNTER — Other Ambulatory Visit (HOSPITAL_BASED_OUTPATIENT_CLINIC_OR_DEPARTMENT_OTHER): Payer: Self-pay

## 2022-09-11 ENCOUNTER — Other Ambulatory Visit: Payer: Self-pay | Admitting: "Endocrinology

## 2022-09-11 DIAGNOSIS — E119 Type 2 diabetes mellitus without complications: Secondary | ICD-10-CM

## 2022-10-05 ENCOUNTER — Other Ambulatory Visit: Payer: Self-pay | Admitting: Family Medicine

## 2022-10-05 DIAGNOSIS — N63 Unspecified lump in unspecified breast: Secondary | ICD-10-CM

## 2022-10-11 ENCOUNTER — Encounter: Payer: Self-pay | Admitting: "Endocrinology

## 2022-10-18 ENCOUNTER — Other Ambulatory Visit: Payer: Self-pay | Admitting: "Endocrinology

## 2022-11-09 ENCOUNTER — Ambulatory Visit (HOSPITAL_BASED_OUTPATIENT_CLINIC_OR_DEPARTMENT_OTHER): Admitting: Family Medicine

## 2022-11-21 ENCOUNTER — Ambulatory Visit
Admission: RE | Admit: 2022-11-21 | Discharge: 2022-11-21 | Disposition: A | Discharge status: Home / Self Care | Source: Ambulatory Visit | Attending: Family Medicine

## 2022-11-21 DIAGNOSIS — N63 Unspecified lump in unspecified breast: Secondary | ICD-10-CM

## 2022-11-23 ENCOUNTER — Encounter (HOSPITAL_BASED_OUTPATIENT_CLINIC_OR_DEPARTMENT_OTHER): Payer: Self-pay | Admitting: Family Medicine

## 2022-11-23 ENCOUNTER — Ambulatory Visit (INDEPENDENT_AMBULATORY_CARE_PROVIDER_SITE_OTHER): Admitting: Family Medicine

## 2022-11-23 DIAGNOSIS — E559 Vitamin D deficiency, unspecified: Secondary | ICD-10-CM | POA: Insufficient documentation

## 2022-11-23 DIAGNOSIS — E1165 Type 2 diabetes mellitus with hyperglycemia: Secondary | ICD-10-CM

## 2022-11-23 DIAGNOSIS — Z7984 Long term (current) use of oral hypoglycemic drugs: Secondary | ICD-10-CM | POA: Diagnosis not present

## 2022-11-23 DIAGNOSIS — Z0000 Encounter for general adult medical examination without abnormal findings: Secondary | ICD-10-CM

## 2022-11-23 NOTE — Progress Notes (Signed)
    Procedures performed today:    None.  Independent interpretation of notes and tests performed by another provider:   None.  Brief History, Exam, Impression, and Recommendations:    BP 129/73 (BP Location: Right Arm, Patient Position: Sitting, Cuff Size: Normal)   Pulse 81   Ht 5\' 6"  (1.676 m)   Wt 208 lb (94.3 kg)   SpO2 97%   BMI 33.57 kg/m   Type 2 diabetes mellitus with hyperglycemia, without long-term current use of insulin (HCC) Assessment & Plan: Patient has established with endocrinology.  Currently utilizing Trulicity, Jardiance, metformin.  Denies any issues with medication at this time.  She also has CGM device available.  Most recent hemoglobin A1c above goal at 9.1%.  She does need to arrange for follow-up with endocrinologist in order to monitor progress with current medications as well as recheck hemoglobin A1c.  She plans to schedule this in the near future. For now, can continue with current medication regimen.  Could consider adjusting dose of Trulicity given that she has been tolerating this well so far We did discuss lifestyle modifications, consideration for meeting with nutritionist.  She indicates that she has nutritionist to work that she does plan to meet with Referral to ophthalmology placed today in order to complete retinopathy screening Plan to complete foot exam at future office visit  Orders: -     Ambulatory referral to Ophthalmology  Vitamin D deficiency Assessment & Plan: Noted in the past.  Has utilized vitamin D supplement previously.  She currently indicates that she has been utilizing vitamin D drops in her diet, not certain on dose of vitamin D drops.  She reports that she did have vitamin D level checked through her work where they have a functional medicine provider.  She indicates a vitamin D level at that time was around 23 and thus continues to be low. She would be amenable to recheck a vitamin D level to assess progress with current  supplementation regimen.  If this continues to be low, may need to make adjustments with supplementation.  If low, consider utilizing 1000 to 2000 international unit dose.  She would prefer to have labs drawn with endocrinology when she has labs drawn with them  Orders: -     VITAMIN D 25 Hydroxy (Vit-D Deficiency, Fractures); Future  Return in about 3 months (around 02/23/2023) for CPE with fasting labs 1 week prior.   ___________________________________________ Venissa Nappi [place], MD, ABFM, CAQSM Primary Care and Sports Medicine [facility]

## 2022-11-23 NOTE — Assessment & Plan Note (Signed)
Patient has established with endocrinology.  Currently utilizing Trulicity, Jardiance, metformin.  Denies any issues with medication at this time.  She also has CGM device available.  Most recent hemoglobin A1c above goal at 9.1%.  She does need to arrange for follow-up with endocrinologist in order to monitor progress with current medications as well as recheck hemoglobin A1c.  She plans to schedule this in the near future. For now, can continue with current medication regimen.  Could consider adjusting dose of Trulicity given that she has been tolerating this well so far We did discuss lifestyle modifications, consideration for meeting with nutritionist.  She indicates that she has nutritionist to work that she does plan to meet with Referral to ophthalmology placed today in order to complete retinopathy screening Plan to complete foot exam at future office visit

## 2022-11-23 NOTE — Assessment & Plan Note (Signed)
Noted in the past.  Has utilized vitamin D supplement previously.  She currently indicates that she has been utilizing vitamin D drops in her diet, not certain on dose of vitamin D drops.  She reports that she did have vitamin D level checked through her work where they have a functional medicine provider.  She indicates a vitamin D level at that time was around 23 and thus continues to be low. She would be amenable to recheck a vitamin D level to assess progress with current supplementation regimen.  If this continues to be low, may need to make adjustments with supplementation.  If low, consider utilizing 1000 to 2000 international unit dose.  She would prefer to have labs drawn with endocrinology when she has labs drawn with them

## 2022-12-04 IMAGING — MG DIGITAL DIAGNOSTIC BILAT W/ TOMO W/ CAD
8 series · 8 of 24 positions shown · non-contrast
Comparison: Previous exams.

CLINICAL DATA: 50-year-old female presents for short-term follow-up
of probably benign low right axillary lymph node seen on mammography
only and probably benign asymmetry in the lateral left breast.

EXAM:
DIGITAL DIAGNOSTIC BILATERAL MAMMOGRAM WITH TOMOSYNTHESIS AND CAD
TECHNIQUE: Bilateral digital diagnostic mammography and breast tomosynthesis
was performed. The images were evaluated with computer-aided
detection.

[R MLO synth-2D]
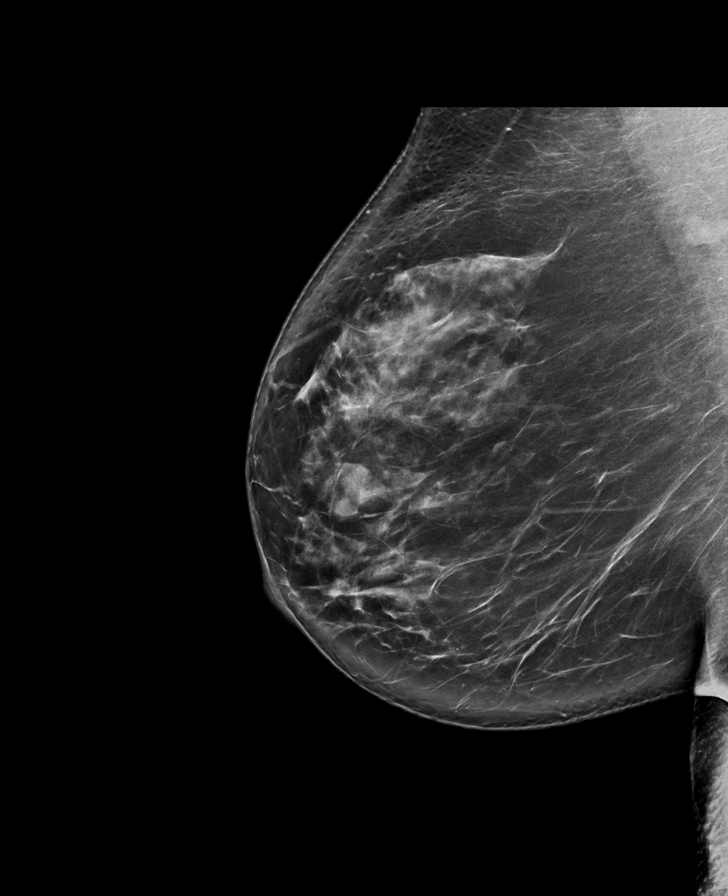

[L MLO synth-2D]
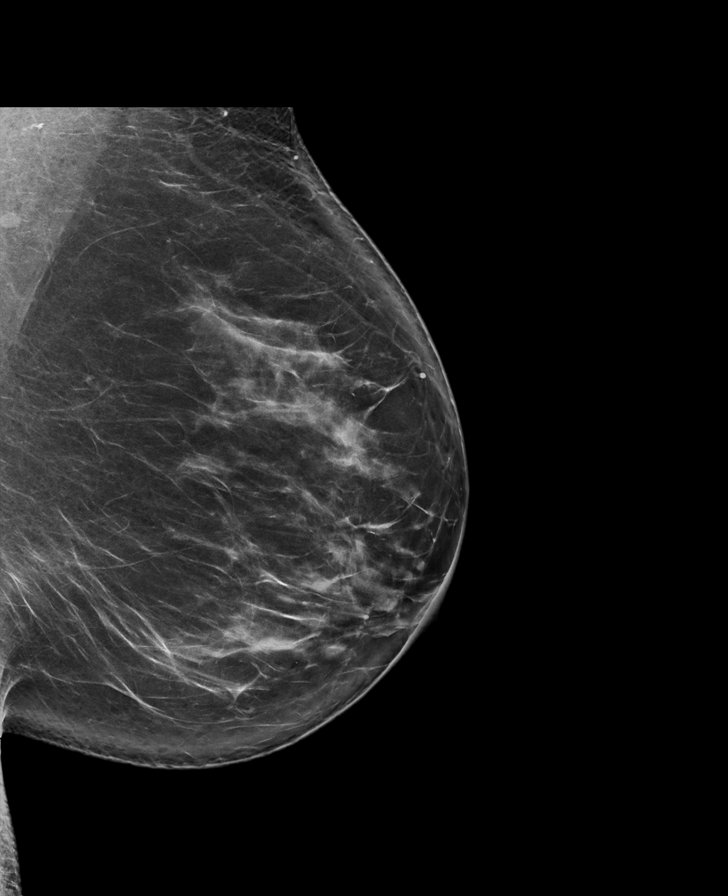

[R CC synth-2D]
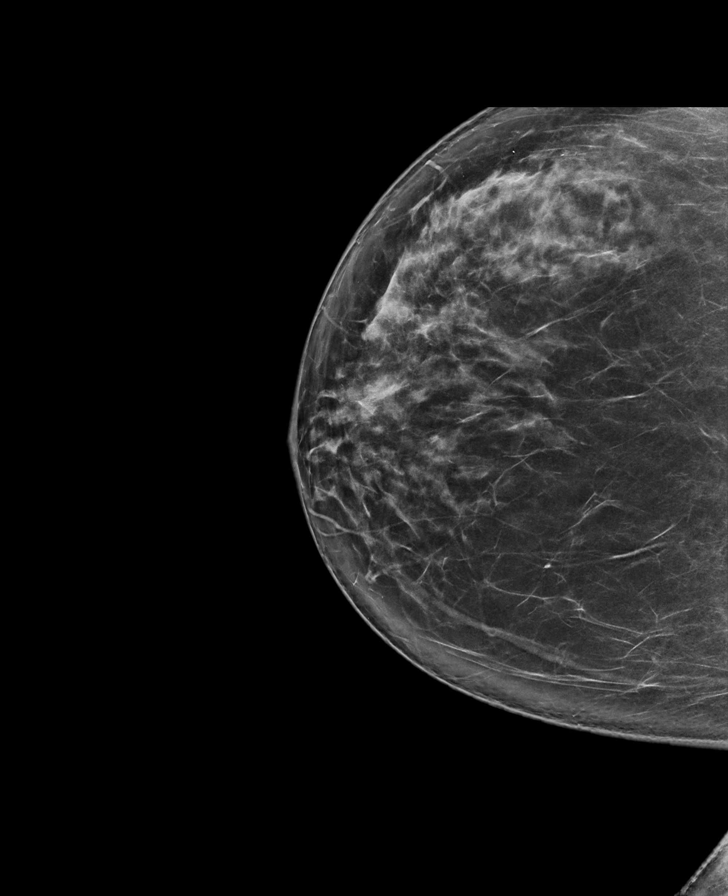

[L CC synth-2D]
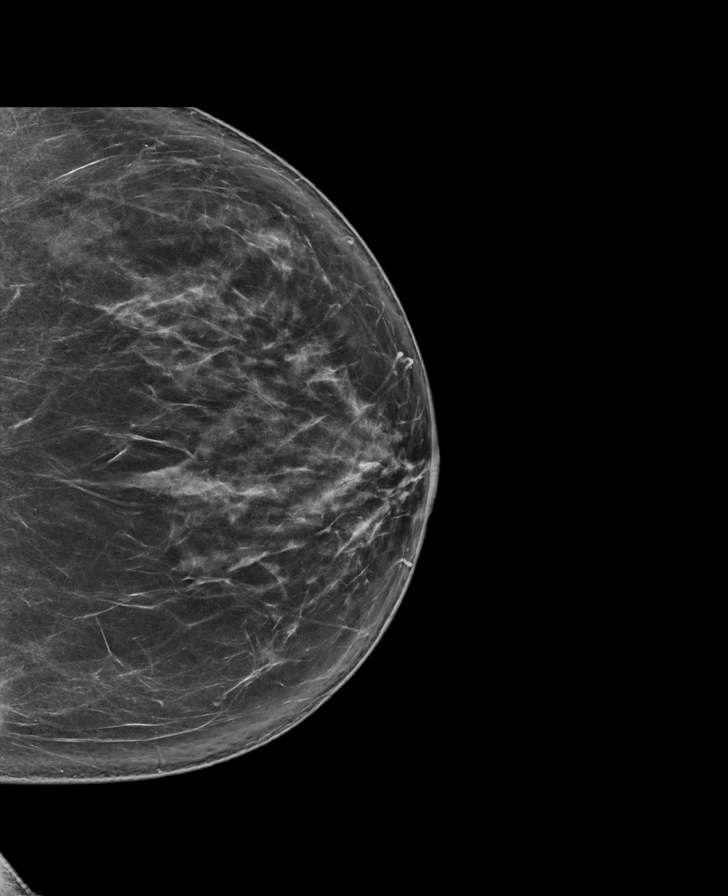

[R CC tomo · tomo slice 44/87.0]
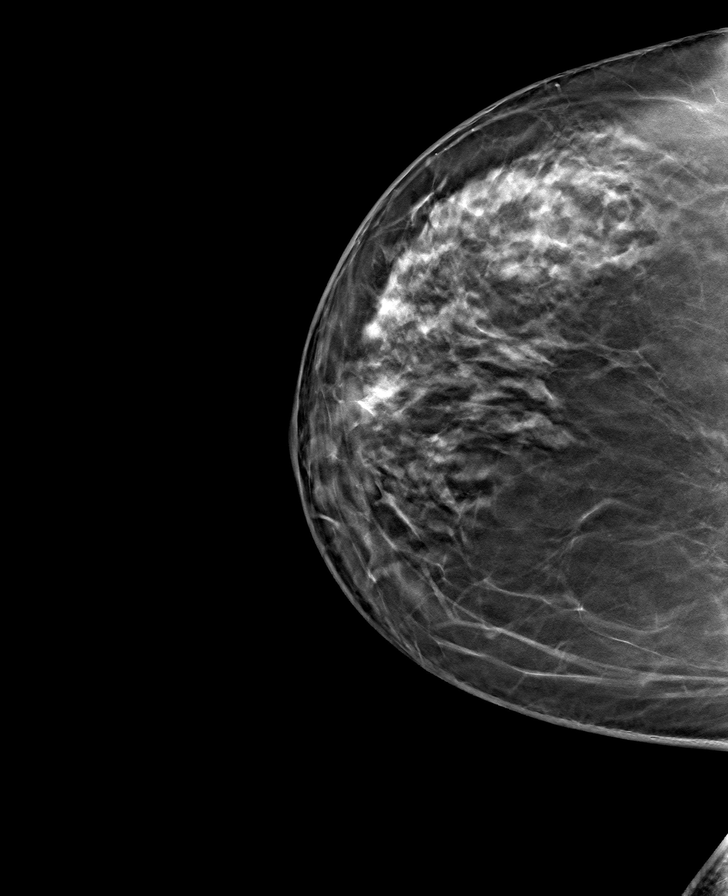

[R MLO tomo · tomo slice 51/102.0]
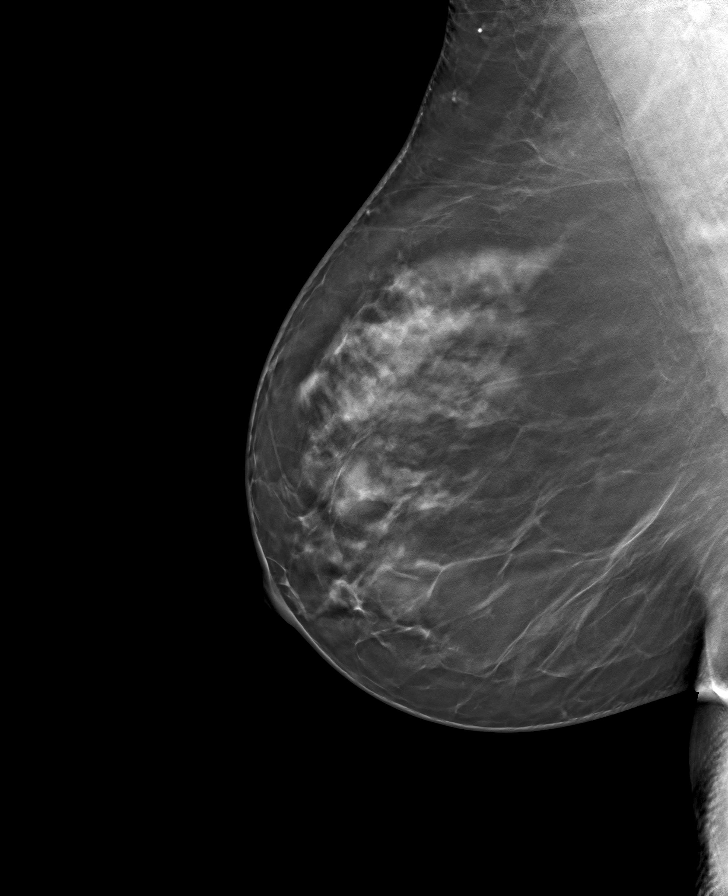

[L MLO tomo · tomo slice 51/101.0]
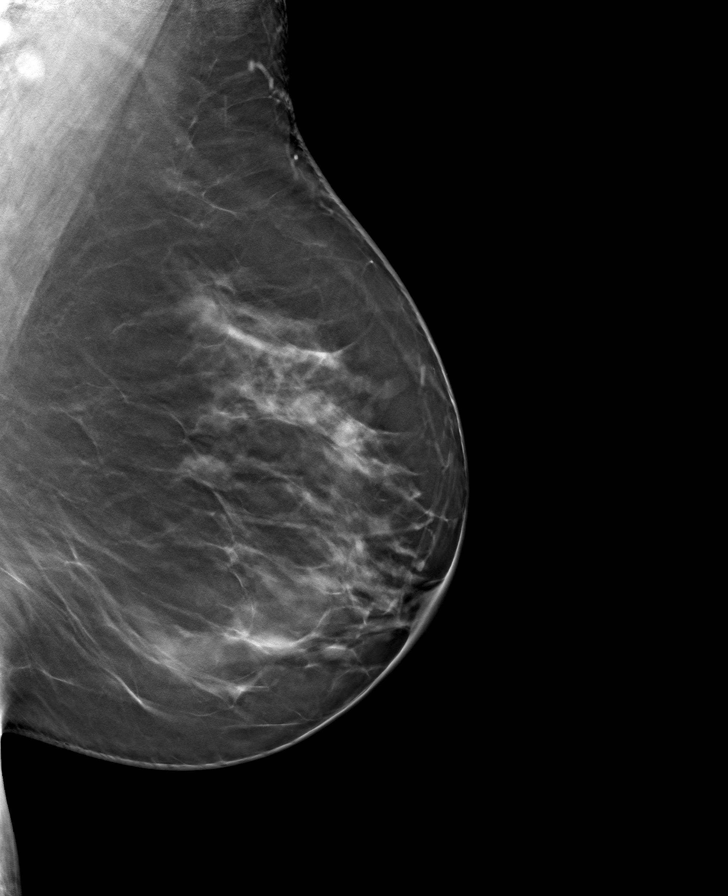

[L CC tomo · tomo slice 44/87.0]
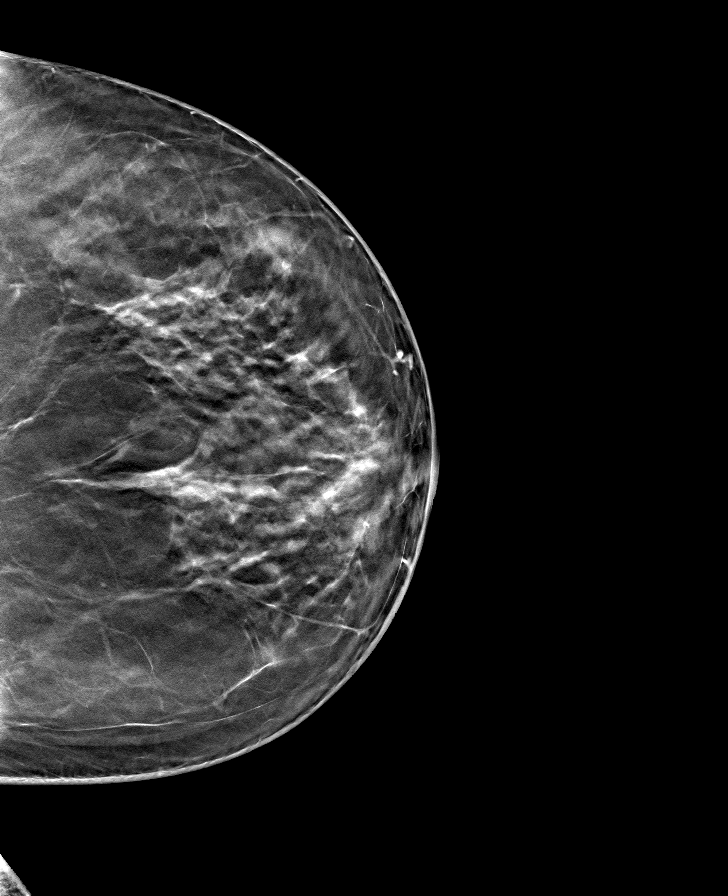

[8 of 24 positions shown; findings below may reference images not displayed]

ACR Breast Density Category b: There are scattered areas of
fibroglandular density.
FINDINGS: No suspicious masses or calcifications are seen in either breast.
The probably benign oval mass in the far posterosuperior right
breast on the MLO view likely representing axillary lymph node is
unchanged. The asymmetry in the lateral left breast is felt to be
overall stable in appearance when compared to prior mammogram from
06/26/2019. There is no mammographic evidence of malignancy in
either breast.
IMPRESSION: Stable probably benign right axillary mass and probably benign left
breast asymmetry.

RECOMMENDATION:
Recommend bilateral diagnostic mammography in 6 months which will
demonstrate 1 year of stability of the probably benign findings in
the bilateral breast.

I have discussed the findings and recommendations with the patient.
If applicable, a reminder letter will be sent to the patient
regarding the next appointment.

BI-RADS CATEGORY  3: Probably benign.

## 2022-12-07 ENCOUNTER — Encounter (HOSPITAL_BASED_OUTPATIENT_CLINIC_OR_DEPARTMENT_OTHER): Payer: Self-pay | Admitting: Family Medicine

## 2023-01-04 ENCOUNTER — Encounter: Payer: Self-pay | Admitting: "Endocrinology

## 2023-01-08 ENCOUNTER — Other Ambulatory Visit: Payer: Self-pay | Admitting: "Endocrinology

## 2023-01-08 ENCOUNTER — Encounter: Payer: Self-pay | Admitting: "Endocrinology

## 2023-01-08 ENCOUNTER — Ambulatory Visit (INDEPENDENT_AMBULATORY_CARE_PROVIDER_SITE_OTHER): Payer: Self-pay | Admitting: "Endocrinology

## 2023-01-08 VITALS — BP 122/70 | HR 86 | Ht 66.0 in | Wt 208.4 lb

## 2023-01-08 DIAGNOSIS — E7800 Pure hypercholesterolemia, unspecified: Secondary | ICD-10-CM

## 2023-01-08 DIAGNOSIS — E1165 Type 2 diabetes mellitus with hyperglycemia: Secondary | ICD-10-CM

## 2023-01-08 DIAGNOSIS — Z7984 Long term (current) use of oral hypoglycemic drugs: Secondary | ICD-10-CM

## 2023-01-08 LAB — POCT GLYCOSYLATED HEMOGLOBIN (HGB A1C): Hemoglobin A1C: 7.1 % — AB (ref 4.0–5.6)

## 2023-01-08 MED ORDER — SYNJARDY 12.5-1000 MG PO TABS: 1.0000 | ORAL_TABLET | Freq: Two times a day (BID) | ORAL | 5 refills | Status: DC

## 2023-01-08 MED ORDER — TRULICITY 1.5 MG/0.5ML ~~LOC~~ SOAJ: 1.5000 mg | SUBCUTANEOUS | 0 refills | Status: AC

## 2023-01-08 MED ORDER — PEN NEEDLES 32G X 4 MM MISC: 1.0000 | Freq: Four times a day (QID) | 2 refills | Status: DC

## 2023-01-08 MED ORDER — INSULIN LISPRO (1 UNIT DIAL) 100 UNIT/ML (KWIKPEN): PEN_INJECTOR | SUBCUTANEOUS | 3 refills | Status: AC

## 2023-01-08 MED ORDER — FREESTYLE LIBRE 3 PLUS SENSOR MISC: 3 refills | Status: DC

## 2023-01-08 MED ORDER — PEN NEEDLES 32G X 4 MM MISC: 1.0000 | Freq: Four times a day (QID) | 2 refills | Status: AC

## 2023-01-08 NOTE — Progress Notes (Addendum)
Outpatient Endocrinology Note Rebecca Lewisberry, MD  02/20/23   Rebecca Hernandez [birth year] [id number]  Referring Provider: de [place], Raymond J, MD Primary Care Provider: de [place], Raymond J, MD Reason for consultation: Subjective   Assessment & Plan  Diagnoses and all orders for this visit:  Uncontrolled type 2 diabetes mellitus with hyperglycemia (HCC) -     POCT glycosylated hemoglobin (Hb A1C)  Long term (current) use of oral hypoglycemic drugs  Pure hypercholesterolemia  Other orders -     Empagliflozin-metFORMIN HCl (SYNJARDY) 12.05-998 MG TABS; Take 1 tablet by mouth 2 (two) times daily. -     Dulaglutide (TRULICITY) 1.5 MG/0.5ML SOAJ; Inject 1.5 mg into the skin once a week. -     insulin lispro (HUMALOG KWIKPEN) 100 UNIT/ML KwikPen; Based on sliding scale, max dose 25 units/day -     Discontinue: Insulin Pen Needle (PEN NEEDLES) 32G X 4 MM MISC; 1 Device by Does not apply route in the morning, at noon, in the evening, and at bedtime. -     Insulin Pen Needle (PEN NEEDLES) 32G X 4 MM MISC; 1 Device by Does not apply route in the morning, at noon, in the evening, and at bedtime. -     Discontinue: Continuous Glucose Sensor (FREESTYLE LIBRE 3 PLUS SENSOR) MISC; Change sensor every 15 days.    Diabetes Type II complicated by neuropathy  Lab Results  Component Value Date   GFR 103.82 07/12/2022   Hba1c goal less than 7, current Hba1c is 7.1% Lab Results  Component Value Date   HGBA1C 7.1 (A) 01/08/2023   Will recommend the following: Start synjardy 12.5/1000mg  bid (stop Metformin 500 mg 2 pills bid and Jardiance 10 mg every day) Trucility 1.5 mg weekly Humalog sliding scale 1:50>200, starting at 2 units  Correction scale: Use Humalog insulin based on blood sugars as follows: 201 - 250: 2 units 251 - 300: 3 units 301 - 350: 4 units 351 - 400: 5 units  Take Humalog at least 4 hours apart  DexCom not covered unless on insulin, ordered Libre 3 +  No known  contraindications to any of above medications  12/27/2022 fasting  LDL 103 Tg 85 Chol 180 GFR 109 AST 20 ALT 20  Lab Results  Component Value Date   LDLCALC 104 (H) 02/16/2023    Lab Results  Component Value Date   TRIG 70 02/16/2023   -Doesn't want statin at this point, discussed at length, patient will read about it -Follow low fat diet and exercise   -Blood pressure goal <140/90 - Microalbumin/creatinine goal < 30 -Last MA/Cr is as follows: Lab Results  Component Value Date   MICROALBUR <0.7 07/12/2022   -not on ACE/ARB  -diet changes including salt restriction -limit eating outside -counseled BP targets per standards of diabetes care -uncontrolled blood pressure can lead to retinopathy, nephropathy and cardiovascular and atherosclerotic heart disease  Reviewed and counseled on: -A1C target -Blood sugar targets -Complications of uncontrolled diabetes  -Checking blood sugar before meals and bedtime and bring log next visit -All medications with mechanism of action and side effects -Hypoglycemia management: rule of 15's, Glucagon Emergency Kit and medical alert ID -low-carb low-fat plate-method diet -At least 20 minutes of physical activity per day -Annual dilated retinal eye exam and foot exam -compliance and follow up needs -follow up as scheduled or earlier if problem gets worse  Call if blood sugar is less than 70 or consistently above 250    Take a 15  gm snack of carbohydrate at bedtime before you go to sleep if your blood sugar is less than 100.    If you are going to fast after midnight for a test or procedure, ask your physician for instructions on how to reduce/decrease your insulin dose.    Call if blood sugar is less than 70 or consistently above 250  -Treating a low sugar by rule of 15  (15 gms of sugar every 15 min until sugar is more than 70) If you feel your sugar is low, test your sugar to be sure If your sugar is low (less than 70), then  take 15 grams of a fast acting Carbohydrate (3-4 glucose tablets or glucose gel or 4 ounces of juice or regular soda) Recheck your sugar 15 min after treating low to make sure it is more than 70 If sugar is still less than 70, treat again with 15 grams of carbohydrate          Don't drive the hour of hypoglycemia  If unconscious/unable to eat or drink by mouth, use glucagon injection or nasal spray baqsimi and call 911. Can repeat again in 15 min if still unconscious.  Return in about 4 weeks (around 02/05/2023) for visit and 8 am labs before next visit.   I have reviewed current medications, nurse's notes, allergies, vital signs, past medical and surgical history, family medical history, and social history for this encounter. Counseled patient on symptoms, examination findings, lab findings, imaging results, treatment decisions and monitoring and prognosis. The patient understood the recommendations and agrees with the treatment plan. All questions regarding treatment plan were fully answered.  Rebecca Williams, MD  02/20/23   History of Present Illness Rebecca Hernandez is a 52 y.o. year old female who presents for follow up of Type II diabetes mellitus.  Rebecca Hernandez was first diagnosed in 2023.   Diabetes education +  Home diabetes regimen: Metformin 500 mg 2 pills bid Jardiance 10 mg every day Trucility 0.75 mg weekly  COMPLICATIONS -  MI/Stroke -  retinopathy + neuropathy -  nephropathy  BLOOD SUGAR DATA Checks BG 1-2/day 96-147 fasting 200s post meal  Physical Exam  BP 122/70   Pulse 86   Ht 5\' 6"  (1.676 m)   Wt 208 lb 6.4 oz (94.5 kg)   SpO2 98%   BMI 33.64 kg/m    Constitutional: well developed, well nourished Head: normocephalic, atraumatic Eyes: sclera anicteric, no redness Neck: supple Lungs: normal respiratory effort Neurology: alert and oriented Skin: dry, no appreciable rashes Musculoskeletal: no appreciable defects Psychiatric: normal mood  and affect Diabetic Foot Exam - Simple   Simple Foot Form  01/08/2023 11:36 AM  Visual Inspection Sensation Testing Pulse Check Comments      Current Medications Patient's Medications  New Prescriptions   DULAGLUTIDE (TRULICITY) 1.5 MG/0.5ML SOAJ    Inject 1.5 mg into the skin once a week.   EMPAGLIFLOZIN-METFORMIN HCL (SYNJARDY) 12.05-998 MG TABS    Take 1 tablet by mouth 2 (two) times daily.   INSULIN LISPRO (HUMALOG KWIKPEN) 100 UNIT/ML KWIKPEN    Based on sliding scale, max dose 25 units/day   INSULIN PEN NEEDLE (PEN NEEDLES) 32G X 4 MM MISC    1 Device by Does not apply route in the morning, at noon, in the evening, and at bedtime.  Previous Medications   CONTINUOUS GLUCOSE SENSOR (FREESTYLE LIBRE 3 PLUS SENSOR) MISC    CHANGE SENSOR EVERY 15 DAYS.  Modified Medications   No medications  on file  Discontinued Medications   CONTINUOUS GLUCOSE SENSOR (DEXCOM G7 SENSOR) MISC    1 Device by Does not apply route continuous.   DULAGLUTIDE (TRULICITY) 0.75 MG/0.5ML SOPN    INJECT 0.75 MG SUBCUTANEOUSLY ONE TIME PER WEEK   EMPAGLIFLOZIN (JARDIANCE) 10 MG TABS TABLET    Take 1 tablet (10 mg total) by mouth daily before breakfast.   METFORMIN (GLUCOPHAGE) 500 MG TABLET    TAKE 2 TABLETS (1,000 MG TOTAL) BY MOUTH 2 (TWO) TIMES DAILY WITH A MEAL.    Allergies Allergies  Allergen Reactions   Dilaudid [Hydromorphone Hcl] Shortness Of Breath    Disoriented. Burning sensation constant.    Past Medical History Past Medical History:  Diagnosis Date   Bell's palsy    Less than 35 years old    Diabetes (HCC)    Eczema    GERD (gastroesophageal reflux disease)    Left-sided face pain    Migraine headache     Past Surgical History Past Surgical History:  Procedure Laterality Date   BIOPSY N/A 03/27/2016   Procedure: BIOPSY small bowel;  Surgeon: West Bali, MD;  [facility];  Service: Endoscopy;  Laterality: N/A;  small bowel biopsy   CHOLECYSTECTOMY N/A last year  sept. 2015   COLONOSCOPY N/A 03/27/2016   Procedure: COLONOSCOPY;  Surgeon: West Bali, MD;  [facility]: [facility];  Service: Endoscopy;  Laterality: N/A;  8:30am   ESOPHAGOGASTRODUODENOSCOPY N/A 03/27/2016   Procedure: ESOPHAGOGASTRODUODENOSCOPY (EGD);  Surgeon: West Bali, MD;  [facility]: [facility];  Service: Endoscopy;  Laterality: N/A;   LAPAROSCOPIC ENDOMETRIOSIS FULGURATION      Family History family history includes Diabetes in her maternal uncle; Healthy in her father and mother; Heart attack in her maternal grandmother.  Social History Social History   Socioeconomic History   Marital status: Married    Spouse name: Not on file   Number of children: 0   Years of education: Not on file   Highest education level: Not on file  Occupational History   Occupation: Technical sales engineer of [employer]  Tobacco Use   Smoking status: Never    Passive exposure: Never   Smokeless tobacco: Never   Tobacco comments:    Never smoked  Substance and Sexual Activity   Alcohol use: Yes    Alcohol/week: 1.0 standard drink of alcohol    Types: 1 Glasses of wine per week    Comment: rare   Drug use: No   Sexual activity: Not on file  Other Topics Concern   Not on file  Social History Narrative   She is from [place]. Husband is in the [employer].    Right handed   Lives at home with husband    Caffeine~ 1 cup per day    Social Drivers of Health   Financial Resource Strain: Not on file  Food Insecurity: Not on file  Transportation Needs: Not on file  Physical Activity: Not on file  Stress: Not on file  Social Connections: Not on file  Intimate Partner Violence: Not on file    Lab Results  Component Value Date   HGBA1C 7.1 (A) 01/08/2023   HGBA1C 9.1 (H) 07/12/2022   HGBA1C 11.1 (H) 02/14/2022   Lab Results  Component Value Date   CHOL 165 02/16/2023   Lab Results  Component Value Date   HDL 47 02/16/2023   Lab Results  Component Value Date   LDLCALC 104 (H)  02/16/2023  Lab Results  Component Value Date   TRIG 70 02/16/2023   Lab Results  Component Value Date   CHOLHDL 3.5 02/16/2023   Lab Results  Component Value Date   CREATININE 0.71 02/16/2023   Lab Results  Component Value Date   GFR 103.82 07/12/2022   Lab Results  Component Value Date   MICROALBUR <0.7 07/12/2022      Component Value Date/Time   NA 138 02/16/2023 0923   K 4.6 02/16/2023 0923   CL 104 02/16/2023 0923   CO2 22 02/16/2023 0923   GLUCOSE 173 (H) 02/16/2023 0923   GLUCOSE 287 (H) 07/12/2022 0910   BUN 11 02/16/2023 0923   CREATININE 0.71 02/16/2023 0923   CALCIUM 9.1 02/16/2023 0923   PROT 6.7 02/16/2023 0923   ALBUMIN 4.0 02/16/2023 0923   AST 20 02/16/2023 0923   ALT 21 02/16/2023 0923   ALKPHOS 95 02/16/2023 0923   BILITOT 0.4 02/16/2023 0923   GFRNONAA 80 09/17/2018 0943   GFRAA 92 09/17/2018 0943      Latest Ref Rng & Units 02/16/2023    9:23 AM 07/12/2022    9:10 AM 02/14/2022    2:31 PM  BMP  Glucose 70 - 99 mg/dL 409  811  914   BUN 6 - 24 mg/dL 11  13  11    Creatinine 0.57 - 1.00 mg/dL 7.82  9.56  2.13   BUN/Creat Ratio 9 - 23 15   15    Sodium 134 - 144 mmol/L 138  133  135   Potassium 3.5 - 5.2 mmol/L 4.6  4.2  4.0   Chloride 96 - 106 mmol/L 104  102  100   CO2 20 - 29 mmol/L 22  23  23    Calcium 8.7 - 10.2 mg/dL 9.1  8.7  9.3        Component Value Date/Time   WBC 6.4 02/16/2023 0923   WBC 5.5 09/03/2013 1028   RBC 4.37 02/16/2023 0923   RBC 4.4 09/03/2013 1028   HGB 11.9 02/16/2023 0923   HCT 37.4 02/16/2023 0923   PLT 358 02/16/2023 0923   MCV 86 02/16/2023 0923   MCH 27.2 02/16/2023 0923   MCH 26.7 (A) 09/03/2013 1028   MCHC 31.8 02/16/2023 0923   MCHC 31.7 (A) 09/03/2013 1028   RDW 13.6 02/16/2023 0923   LYMPHSABS 2.2 02/16/2023 0923   EOSABS 0.2 02/16/2023 0923   BASOSABS 0.1 02/16/2023 0923     Parts of this note may have been dictated using voice recognition software. There may be variances in spelling and  vocabulary which are unintentional. Not all errors are proofread. Please notify the Thereasa Parkin if any discrepancies are noted or if the meaning of any statement is not clear.

## 2023-01-08 NOTE — Patient Instructions (Signed)
  Correction scale: Use Humalog insulin based on blood sugars as follows:  201 - 250: 2 units 251 - 300: 3 units 301 - 350: 4 units 351 - 400: 5 units   Take Humalog at least 4 hours apart

## 2023-01-09 ENCOUNTER — Other Ambulatory Visit (HOSPITAL_COMMUNITY): Payer: Self-pay

## 2023-01-09 ENCOUNTER — Telehealth: Payer: Self-pay | Admitting: Pharmacy Technician

## 2023-01-09 NOTE — Telephone Encounter (Addendum)
A new encounter has been created for PA f/u, please sign off on rx in this encounter as PA team is unable to resolve RX requests. Thank you

## 2023-01-09 NOTE — Telephone Encounter (Addendum)
Pharmacy Patient Advocate Encounter   Received notification from RX Request Messages that prior authorization for Freestyle Libre 3 Plus is required/requested.   Insurance verification completed.   The patient is insured through [employer] .    Per test claim:  Dexcom is preferred by the insurance.  If suggested medication is appropriate, Please send in a new RX and discontinue this one. If not, please advise as to why it's not appropriate so that we may request a Prior Authorization. Please note, some preferred medications may still require a PA  Looks like Dexcom PA was previously denied because the pt wasn't using insulin. But now that she is using insulin, it should be covered. Would you like to change the order to [facility] and have [place] do a PA for that?

## 2023-01-10 ENCOUNTER — Other Ambulatory Visit: Payer: Self-pay | Admitting: "Endocrinology

## 2023-01-11 ENCOUNTER — Other Ambulatory Visit (HOSPITAL_BASED_OUTPATIENT_CLINIC_OR_DEPARTMENT_OTHER): Payer: Self-pay

## 2023-01-11 ENCOUNTER — Other Ambulatory Visit: Payer: Self-pay | Admitting: "Endocrinology

## 2023-01-11 ENCOUNTER — Other Ambulatory Visit: Payer: Self-pay

## 2023-01-11 MED ORDER — DEXCOM G7 SENSOR MISC
1.0000 | 3 refills | Status: DC
  Filled 2023-01-11: qty 9, 90d supply, fill #0
  Filled 2023-01-29: qty 9, 84d supply, fill #0
  Filled 2023-02-17: qty 9, 90d supply, fill #0

## 2023-01-11 NOTE — Telephone Encounter (Signed)
  Freestyle Libre sensors refill request complete

## 2023-01-12 ENCOUNTER — Other Ambulatory Visit (HOSPITAL_COMMUNITY): Payer: Self-pay

## 2023-01-12 NOTE — Telephone Encounter (Signed)
Thank you. I did a test claim and the sensors go through for $72. However, I see you sent the order to [facility] & they aren't contracted with [employer]. Our [facility] is. However, it looks like she get's all her other meds at [facility] in [employer] on [address].

## 2023-01-29 ENCOUNTER — Other Ambulatory Visit (HOSPITAL_BASED_OUTPATIENT_CLINIC_OR_DEPARTMENT_OTHER): Payer: Self-pay

## 2023-02-14 ENCOUNTER — Encounter (HOSPITAL_BASED_OUTPATIENT_CLINIC_OR_DEPARTMENT_OTHER): Payer: Self-pay

## 2023-02-16 ENCOUNTER — Other Ambulatory Visit (HOSPITAL_BASED_OUTPATIENT_CLINIC_OR_DEPARTMENT_OTHER)

## 2023-02-16 DIAGNOSIS — Z0000 Encounter for general adult medical examination without abnormal findings: Secondary | ICD-10-CM

## 2023-02-16 DIAGNOSIS — E559 Vitamin D deficiency, unspecified: Secondary | ICD-10-CM

## 2023-02-17 LAB — COMPREHENSIVE METABOLIC PANEL
ALT: 21 [IU]/L (ref 0–32)
AST: 20 [IU]/L (ref 0–40)
Albumin: 4 g/dL (ref 3.8–4.9)
Alkaline Phosphatase: 95 [IU]/L (ref 44–121)
BUN/Creatinine Ratio: 15 (ref 9–23)
BUN: 11 mg/dL (ref 6–24)
Bilirubin Total: 0.4 mg/dL (ref 0.0–1.2)
CO2: 22 mmol/L (ref 20–29)
Calcium: 9.1 mg/dL (ref 8.7–10.2)
Chloride: 104 mmol/L (ref 96–106)
Creatinine, Ser: 0.71 mg/dL (ref 0.57–1.00)
Globulin, Total: 2.7 g/dL (ref 1.5–4.5)
Glucose: 173 mg/dL — ABNORMAL HIGH (ref 70–99)
Potassium: 4.6 mmol/L (ref 3.5–5.2)
Sodium: 138 mmol/L (ref 134–144)
Total Protein: 6.7 g/dL (ref 6.0–8.5)
eGFR: 103 mL/min/{1.73_m2} (ref >59)

## 2023-02-17 LAB — CBC WITH DIFFERENTIAL/PLATELET
Basophils Absolute: 0.1 10*3/uL (ref 0.0–0.2)
Basos: 1 %
EOS (ABSOLUTE): 0.2 10*3/uL (ref 0.0–0.4)
Eos: 4 %
Hematocrit: 37.4 % (ref 34.0–46.6)
Hemoglobin: 11.9 g/dL (ref 11.1–15.9)
Immature Grans (Abs): 0 10*3/uL (ref 0.0–0.1)
Immature Granulocytes: 0 %
Lymphocytes Absolute: 2.2 10*3/uL (ref 0.7–3.1)
Lymphs: 34 %
MCH: 27.2 pg (ref 26.6–33.0)
MCHC: 31.8 g/dL (ref 31.5–35.7)
MCV: 86 fL (ref 79–97)
Monocytes Absolute: 0.5 10*3/uL (ref 0.1–0.9)
Monocytes: 8 %
Neutrophils Absolute: 3.4 10*3/uL (ref 1.4–7.0)
Neutrophils: 53 %
Platelets: 358 10*3/uL (ref 150–450)
RBC: 4.37 x10E6/uL (ref 3.77–5.28)
RDW: 13.6 % (ref 11.7–15.4)
WBC: 6.4 10*3/uL (ref 3.4–10.8)

## 2023-02-17 LAB — LIPID PANEL
Chol/HDL Ratio: 3.5 {ratio} (ref 0.0–4.4)
Cholesterol, Total: 165 mg/dL (ref 100–199)
HDL: 47 mg/dL (ref >39)
LDL Chol Calc (NIH): 104 mg/dL — ABNORMAL HIGH (ref 0–99)
Triglycerides: 70 mg/dL (ref 0–149)
VLDL Cholesterol Cal: 14 mg/dL (ref 5–40)

## 2023-02-17 LAB — TSH RFX ON ABNORMAL TO FREE T4: TSH: 1.37 u[IU]/mL (ref 0.450–4.500)

## 2023-02-17 LAB — VITAMIN D 25 HYDROXY (VIT D DEFICIENCY, FRACTURES): Vit D, 25-Hydroxy: 18.7 ng/mL — ABNORMAL LOW (ref 30.0–100.0)

## 2023-02-18 ENCOUNTER — Other Ambulatory Visit (HOSPITAL_BASED_OUTPATIENT_CLINIC_OR_DEPARTMENT_OTHER): Payer: Self-pay

## 2023-02-20 ENCOUNTER — Telehealth: Payer: Self-pay

## 2023-02-20 ENCOUNTER — Encounter: Payer: Self-pay | Admitting: "Endocrinology

## 2023-02-20 ENCOUNTER — Ambulatory Visit (INDEPENDENT_AMBULATORY_CARE_PROVIDER_SITE_OTHER): Admitting: "Endocrinology

## 2023-02-20 VITALS — BP 116/80 | HR 82 | Resp 20 | Ht 66.0 in | Wt 211.0 lb

## 2023-02-20 DIAGNOSIS — Z7984 Long term (current) use of oral hypoglycemic drugs: Secondary | ICD-10-CM

## 2023-02-20 DIAGNOSIS — Z7985 Long-term (current) use of injectable non-insulin antidiabetic drugs: Secondary | ICD-10-CM | POA: Diagnosis not present

## 2023-02-20 DIAGNOSIS — E7800 Pure hypercholesterolemia, unspecified: Secondary | ICD-10-CM

## 2023-02-20 DIAGNOSIS — E1165 Type 2 diabetes mellitus with hyperglycemia: Secondary | ICD-10-CM

## 2023-02-20 MED ORDER — FREESTYLE LIBRE 3 PLUS SENSOR MISC
3 refills | Status: DC
Start: 2023-02-20 — End: 2023-04-05

## 2023-02-20 MED ORDER — TRULICITY 3 MG/0.5ML ~~LOC~~ SOAJ: 3.0000 mg | SUBCUTANEOUS | 0 refills | Status: DC

## 2023-02-20 NOTE — Patient Instructions (Signed)

## 2023-02-20 NOTE — Progress Notes (Signed)
 Outpatient Endocrinology Note Obadiah Birmingham, MD  02/20/23   Rebecca Hernandez 08/11/1970 [id number]  Referring Provider: de [place], Raymond J, MD Primary Care Provider: de [place], Raymond J, MD Reason for consultation: Subjective   Assessment & Plan  Diagnoses and all orders for this visit:  Uncontrolled type 2 diabetes mellitus with hyperglycemia (HCC)  Long term (current) use of oral hypoglycemic drugs  Long-term (current) use of injectable non-insulin  antidiabetic drugs  Pure hypercholesterolemia  Other orders -     Dulaglutide  (TRULICITY ) 3 MG/0.5ML SOAJ; Inject 3 mg as directed once a week. -     Continuous Glucose Sensor (FREESTYLE LIBRE 3 PLUS SENSOR) MISC; Change sensor every 15 days.   Diabetes Type II complicated by neuropathy  Lab Results  Component Value Date   GFR 103.82 07/12/2022   Hba1c goal less than 7, current Hba1c is 7.1% Lab Results  Component Value Date   HGBA1C 7.1 (A) 01/08/2023   Will recommend the following: Synjardy  12.5/1000mg  bid  Trucility 3 mg weekly Humalog  sliding scale 1:50>200, starting at 2 units  Correction scale: Use Humalog  insulin  based on blood sugars as follows: 201 - 250: 2 units 251 - 300: 3 units 301 - 350: 4 units 351 - 400: 5 units  Take Humalog  at least 4 hours apart  DexCom not covered unless on insulin , ordered Libre 3 +  No known contraindications to any of above medications  12/27/2022 fasting LDL 103 Tg 85 Chol 180 GFR 109 AST 20 ALT 20  Lab Results  Component Value Date   LDLCALC 104 (H) 02/16/2023    Lab Results  Component Value Date   TRIG 70 02/16/2023   -Doesn't want statin at this point, discussed at length, patient will read about it -Follow low fat diet and exercise   -Blood pressure goal <140/90 - Microalbumin/creatinine goal < 30 -Last MA/Cr is as follows: Lab Results  Component Value Date   MICROALBUR <0.7 07/12/2022   -not on ACE/ARB  -diet changes including salt  restriction -limit eating outside -counseled BP targets per standards of diabetes care -uncontrolled blood pressure can lead to retinopathy, nephropathy and cardiovascular and atherosclerotic heart disease  Reviewed and counseled on: -A1C target -Blood sugar targets -Complications of uncontrolled diabetes  -Checking blood sugar before meals and bedtime and bring log next visit -All medications with mechanism of action and side effects -Hypoglycemia management: rule of 15's, Glucagon Emergency Kit and medical alert ID -low-carb low-fat plate-method diet -At least 20 minutes of physical activity per day -Annual dilated retinal eye exam and foot exam -compliance and follow up needs -follow up as scheduled or earlier if problem gets worse  Call if blood sugar is less than 70 or consistently above 250    Take a 15 gm snack of carbohydrate at bedtime before you go to sleep if your blood sugar is less than 100.    If you are going to fast after midnight for a test or procedure, ask your physician for instructions on how to reduce/decrease your insulin  dose.    Call if blood sugar is less than 70 or consistently above 250  -Treating a low sugar by rule of 15  (15 gms of sugar every 15 min until sugar is more than 70) If you feel your sugar is low, test your sugar to be sure If your sugar is low (less than 70), then take 15 grams of a fast acting Carbohydrate (3-4 glucose tablets or glucose gel or 4 ounces of  juice or regular soda) Recheck your sugar 15 min after treating low to make sure it is more than 70 If sugar is still less than 70, treat again with 15 grams of carbohydrate          Don't drive the hour of hypoglycemia  If unconscious/unable to eat or drink by mouth, use glucagon injection or nasal spray baqsimi and call 911. Can repeat again in 15 min if still unconscious.  Return in about 4 weeks (around 03/20/2023).   I have reviewed current medications, nurse's notes, allergies,  vital signs, past medical and surgical history, family medical history, and social history for this encounter. Counseled patient on symptoms, examination findings, lab findings, imaging results, treatment decisions and monitoring and prognosis. The patient understood the recommendations and agrees with the treatment plan. All questions regarding treatment plan were fully answered.  Obadiah Birmingham, MD  02/20/23   History of Present Illness Rebecca Hernandez is a 53 y.o. year old female who presents for follow up of Type II diabetes mellitus.  Rebecca Hernandez was first diagnosed in 2023.   Diabetes education +  Home diabetes regimen: Metformin  500 mg 2 pills bid Jardiance  10 mg every day Trucility 1.5 mg weekly (just ran out 2 days ago)  COMPLICATIONS -  MI/Stroke -  retinopathy + neuropathy -  nephropathy  BLOOD SUGAR DATA Checks BG 1-2/day Range 96-297   Physical Exam  BP 116/80 (BP Location: Right Arm, Patient Position: Sitting, Cuff Size: Large)   Pulse 82   Resp 20   Ht 5' 6 (1.676 m)   Wt 211 lb (95.7 kg)   SpO2 97%   BMI 34.06 kg/m    Constitutional: well developed, well nourished Head: normocephalic, atraumatic Eyes: sclera anicteric, no redness Neck: supple Lungs: normal respiratory effort Neurology: alert and oriented Skin: dry, no appreciable rashes Musculoskeletal: no appreciable defects Psychiatric: normal mood and affect Diabetic Foot Exam - Simple   No data filed      Current Medications Patient's Medications  New Prescriptions   CONTINUOUS GLUCOSE SENSOR (FREESTYLE LIBRE 3 PLUS SENSOR) MISC    Change sensor every 15 days.   DULAGLUTIDE  (TRULICITY ) 3 MG/0.5ML SOAJ    Inject 3 mg as directed once a week.  Previous Medications   CONTINUOUS GLUCOSE SENSOR (DEXCOM G7 SENSOR) MISC    Inject 1 Device into the skin every 10 days.   EMPAGLIFLOZIN -METFORMIN  HCL (SYNJARDY ) 12.05-998 MG TABS    Take 1 tablet by mouth 2 (two) times daily.   INSULIN   LISPRO (HUMALOG  KWIKPEN) 100 UNIT/ML KWIKPEN    Based on sliding scale, max dose 25 units/day   INSULIN  PEN NEEDLE (PEN NEEDLES) 32G X 4 MM MISC    1 Device by Does not apply route in the morning, at noon, in the evening, and at bedtime.  Modified Medications   No medications on file  Discontinued Medications   No medications on file    Allergies Allergies  Allergen Reactions   Dilaudid [Hydromorphone Hcl] Shortness Of Breath    Disoriented. Burning sensation constant.    Past Medical History Past Medical History:  Diagnosis Date   Bell's palsy    Less than 15 years old    Diabetes (HCC)    Eczema    GERD (gastroesophageal reflux disease)    Left-sided face pain    Migraine headache     Past Surgical History Past Surgical History:  Procedure Laterality Date   BIOPSY N/A 03/27/2016   Procedure: BIOPSY small bowel;  Surgeon: Margo LITTIE Haddock, MD;  [facility];  Service: Endoscopy;  Laterality: N/A;  small bowel biopsy   CHOLECYSTECTOMY N/A last year sept. 2015   COLONOSCOPY N/A 03/27/2016   Procedure: COLONOSCOPY;  Surgeon: Margo LITTIE Haddock, MD;  [facility];  Service: Endoscopy;  Laterality: N/A;  8:30am   ESOPHAGOGASTRODUODENOSCOPY N/A 03/27/2016   Procedure: ESOPHAGOGASTRODUODENOSCOPY (EGD);  Surgeon: Margo LITTIE Haddock, MD;  [facility];  Service: Endoscopy;  Laterality: N/A;   LAPAROSCOPIC ENDOMETRIOSIS FULGURATION      Family History family history includes Diabetes in her maternal uncle; Healthy in her father and mother; Heart attack in her maternal grandmother.  Social History Social History   Socioeconomic History   Marital status: Married    Spouse name: Not on file   Number of children: 0   Years of education: Not on file   Highest education level: Not on file  Occupational History   Occupation: Technical sales engineer of [employer]  Tobacco Use   Smoking status: Never    Passive exposure: Never   Smokeless tobacco: Never   Tobacco comments:     Never smoked  Substance and Sexual Activity   Alcohol use: Yes    Alcohol/week: 1.0 standard drink of alcohol    Types: 1 Glasses of wine per week    Comment: rare   Drug use: No   Sexual activity: Not on file  Other Topics Concern   Not on file  Social History Narrative   She is from [place]. Husband is in the [place].    Right handed   Lives at home with husband    Caffeine~ 1 cup per day    Social Drivers of Health   Financial Resource Strain: Not on file  Food Insecurity: Not on file  Transportation Needs: Not on file  Physical Activity: Not on file  Stress: Not on file  Social Connections: Not on file  Intimate Partner Violence: Not on file    Lab Results  Component Value Date   HGBA1C 7.1 (A) 01/08/2023   HGBA1C 9.1 (H) 07/12/2022   HGBA1C 11.1 (H) 02/14/2022   Lab Results  Component Value Date   CHOL 165 02/16/2023   Lab Results  Component Value Date   HDL 47 02/16/2023   Lab Results  Component Value Date   LDLCALC 104 (H) 02/16/2023   Lab Results  Component Value Date   TRIG 70 02/16/2023   Lab Results  Component Value Date   CHOLHDL 3.5 02/16/2023   Lab Results  Component Value Date   CREATININE 0.71 02/16/2023   Lab Results  Component Value Date   GFR 103.82 07/12/2022   Lab Results  Component Value Date   MICROALBUR <0.7 07/12/2022      Component Value Date/Time   NA 138 02/16/2023 0923   K 4.6 02/16/2023 0923   CL 104 02/16/2023 0923   CO2 22 02/16/2023 0923   GLUCOSE 173 (H) 02/16/2023 0923   GLUCOSE 287 (H) 07/12/2022 0910   BUN 11 02/16/2023 0923   CREATININE 0.71 02/16/2023 0923   CALCIUM  9.1 02/16/2023 0923   PROT 6.7 02/16/2023 0923   ALBUMIN 4.0 02/16/2023 0923   AST 20 02/16/2023 0923   ALT 21 02/16/2023 0923   ALKPHOS 95 02/16/2023 0923   BILITOT 0.4 02/16/2023 0923   GFRNONAA 80 09/17/2018 0943   GFRAA 92 09/17/2018 0943      Latest Ref Rng & Units 02/16/2023    9:23  AM 07/12/2022    9:10 AM 02/14/2022     2:31 PM  BMP  Glucose 70 - 99 mg/dL 826  712  764   BUN 6 - 24 mg/dL 11  13  11    Creatinine 0.57 - 1.00 mg/dL 9.28  9.38  9.28   BUN/Creat Ratio 9 - 23 15   15    Sodium 134 - 144 mmol/L 138  133  135   Potassium 3.5 - 5.2 mmol/L 4.6  4.2  4.0   Chloride 96 - 106 mmol/L 104  102  100   CO2 20 - 29 mmol/L 22  23  23    Calcium  8.7 - 10.2 mg/dL 9.1  8.7  9.3        Component Value Date/Time   WBC 6.4 02/16/2023 0923   WBC 5.5 09/03/2013 1028   RBC 4.37 02/16/2023 0923   RBC 4.4 09/03/2013 1028   HGB 11.9 02/16/2023 0923   HCT 37.4 02/16/2023 0923   PLT 358 02/16/2023 0923   MCV 86 02/16/2023 0923   MCH 27.2 02/16/2023 0923   MCH 26.7 (A) 09/03/2013 1028   MCHC 31.8 02/16/2023 0923   MCHC 31.7 (A) 09/03/2013 1028   RDW 13.6 02/16/2023 0923   LYMPHSABS 2.2 02/16/2023 0923   EOSABS 0.2 02/16/2023 0923   BASOSABS 0.1 02/16/2023 0923     Parts of this note may have been dictated using voice recognition software. There may be variances in spelling and vocabulary which are unintentional. Not all errors are proofread. Please notify the dino if any discrepancies are noted or if the meaning of any statement is not clear.

## 2023-02-20 NOTE — Telephone Encounter (Signed)
Per patient in the pass she was denied because she was not on insulin treatment, however patient is now on insulin and request PA be redone to see if she can now be approved.

## 2023-02-21 ENCOUNTER — Encounter (HOSPITAL_BASED_OUTPATIENT_CLINIC_OR_DEPARTMENT_OTHER): Payer: Self-pay | Admitting: Family Medicine

## 2023-02-22 ENCOUNTER — Other Ambulatory Visit (HOSPITAL_COMMUNITY): Payer: Self-pay

## 2023-02-22 ENCOUNTER — Telehealth: Payer: Self-pay

## 2023-02-22 NOTE — Telephone Encounter (Signed)
 Insurance will cover dexcom G7 sensor. 30 day copay $24

## 2023-02-22 NOTE — Telephone Encounter (Signed)
 Pharmacy Patient Advocate Encounter   Received notification from Pt Calls Messages that prior authorization for Freestyle libre 3 plus is required/requested.   Insurance verification completed.   The patient is insured through [employer] .   Per test claim: Plan/Benefit Exclusion

## 2023-02-23 ENCOUNTER — Encounter (HOSPITAL_BASED_OUTPATIENT_CLINIC_OR_DEPARTMENT_OTHER): Admitting: Family Medicine

## 2023-03-02 ENCOUNTER — Other Ambulatory Visit (HOSPITAL_COMMUNITY)
Admission: RE | Admit: 2023-03-02 | Discharge: 2023-03-02 | Disposition: A | Discharge status: Home / Self Care | Source: Ambulatory Visit | Attending: Family Medicine | Admitting: Family Medicine

## 2023-03-02 ENCOUNTER — Ambulatory Visit (HOSPITAL_BASED_OUTPATIENT_CLINIC_OR_DEPARTMENT_OTHER): Admitting: Family Medicine

## 2023-03-02 ENCOUNTER — Encounter (HOSPITAL_BASED_OUTPATIENT_CLINIC_OR_DEPARTMENT_OTHER): Payer: Self-pay | Admitting: Family Medicine

## 2023-03-02 VITALS — BP 115/72 | HR 85 | Ht 66.0 in | Wt 209.5 lb

## 2023-03-02 DIAGNOSIS — Z124 Encounter for screening for malignant neoplasm of cervix: Secondary | ICD-10-CM

## 2023-03-02 MED ORDER — DEXCOM G7 SENSOR MISC: 1.0000 | 3 refills | Status: AC

## 2023-03-02 NOTE — Progress Notes (Signed)
Subjective:   Rebecca Hernandez Apr 24, 1970 03/02/2023  Chief Complaint  Patient presents with   Medical Management of Chronic Issues    Patient is here to have her pap smear performed.    HPI: Rebecca Hernandez presents today performance of her pap smear. Reports hx of abnormal pap smear approx. 7-10 years ago. Denies current concerns. Denies need for STI testing. Patient's last menstrual period was 02/19/2023 (approximate).   The following portions of the patient's history were reviewed and updated as appropriate: past medical history, past surgical history, family history, social history, allergies, medications, and problem list.   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   Encounter for annual physical exam 10/20/2021   Adjustment disorder with depressed mood 10/03/2021   Degeneration of lumbar or lumbosacral intervertebral disc 10/03/2021   Internal derangement of knee 10/03/2021   Migraine headache 10/03/2021   Nonspecific abnormal results of kidney function study 10/03/2021   Palpitations 12/05/2020   Diabetes mellitus (HCC) 08/17/2020   Anemia 03/07/2016   Abnormal celiac antibody panel 07/01/2015   GERD (gastroesophageal reflux disease) 09/03/2013   Past Medical History:  Diagnosis Date   Bell's palsy    Less than 27 years old    Diabetes (HCC)    Eczema    GERD (gastroesophageal reflux disease)    Left-sided face pain    Migraine headache    Past Surgical History:  Procedure Laterality Date   BIOPSY N/A 03/27/2016   Procedure: BIOPSY small bowel;  Surgeon: West Bali, MD;  [facility];  Service: Endoscopy;  Laterality: N/A;  small bowel biopsy   CHOLECYSTECTOMY N/A last year sept. 2015   COLONOSCOPY N/A 03/27/2016   Procedure: COLONOSCOPY;  Surgeon: West Bali, MD;  [facility];  Service: Endoscopy;  Laterality: N/A;  8:30am    ESOPHAGOGASTRODUODENOSCOPY N/A 03/27/2016   Procedure: ESOPHAGOGASTRODUODENOSCOPY (EGD);  Surgeon: West Bali, MD;  [facility];  Service: Endoscopy;  Laterality: N/A;   LAPAROSCOPIC ENDOMETRIOSIS FULGURATION     Family History  Problem Relation Age of Onset   Healthy Mother    Healthy Father    Heart attack Maternal Grandmother    Diabetes Maternal Uncle    Allergic rhinitis Neg Hx    Angioedema Neg Hx    Asthma Neg Hx    Atopy Neg Hx    Eczema Neg Hx    Immunodeficiency Neg Hx    Urticaria Neg Hx    Celiac disease Neg Hx    Colon cancer Neg Hx    Outpatient Medications Prior to Visit  Medication Sig Dispense Refill   Continuous Glucose Sensor (FREESTYLE LIBRE 3 PLUS SENSOR) MISC Change sensor every 15 days. 6 each 3   Dulaglutide (TRULICITY) 3 MG/0.5ML SOAJ Inject 3 mg as directed once a week. 2 mL 0   Empagliflozin-metFORMIN HCl (SYNJARDY) 12.05-998 MG TABS Take 1 tablet by mouth 2 (two) times daily. 60 tablet 5   insulin lispro (HUMALOG KWIKPEN) 100 UNIT/ML KwikPen Based on sliding scale, max dose 25 units/day 15 mL 3   Insulin Pen Needle (PEN NEEDLES) 32G X 4 MM MISC 1 Device by Does not apply route in the morning, at noon, in the evening, and at bedtime. 50 each 2   Continuous Glucose Sensor (DEXCOM G7 SENSOR) MISC Inject 1 Device into the skin every 10 days. 9 each 3   No facility-administered medications prior  to visit.   Allergies  Allergen Reactions   Dilaudid [Hydromorphone Hcl] Shortness Of Breath    Disoriented. Burning sensation constant.     ROS: A complete ROS was performed with pertinent positives/negatives noted in the HPI. The remainder of the ROS are negative.    Objective:   Today's Vitals   03/02/23 0917  BP: 115/72  Pulse: 85  SpO2: 98%  Weight: 209 lb 8 oz (95 kg)  Height: 5\' 6"  (1.676 m)    Physical Exam          GENERAL: Well-appearing, in NAD. Well nourished.  SKIN: Pink, warm and dry.  Head: Normocephalic. NECK:  Trachea midline. Full ROM w/o pain or tenderness.  RESPIRATORY: Chest wall symmetrical. Respirations even and non-labored. GU: External genitalia without erythema, lesions, or masses. No lymphadenopathy. Vaginal mucosa pink and moist without exudate, lesions, or ulcerations. Cervix pink without discharge. Cervical os closed. Uterus and adnexae palpable, not enlarged, and w/o tenderness. No palpable masses. Chaperoned by Tamela Oddi May, CMA.  EXTREMITIES: Without clubbing, cyanosis, or edema.  NEUROLOGIC: No motor or sensory deficits. Steady, even gait. C2-C12 intact.  PSYCH/MENTAL STATUS: Alert, oriented x 3. Cooperative, appropriate mood and affect.     Assessment & Plan:  1. Encounter for Papanicolaou smear for cervical cancer screening (Primary) Pap completed in office today and pt tolerated well. Will notify of results when available.  - Cytology - PAP   Meds ordered this encounter  Medications   Continuous Glucose Sensor (DEXCOM G7 SENSOR) MISC    Sig: Inject 1 Device into the skin every 10 days.    Dispense:  9 each    Refill:  3   Return if symptoms worsen or fail to improve.    Patient to reach out to office if new, worrisome, or unresolved symptoms arise or if no improvement in patient's condition. Patient verbalized understanding and is agreeable to treatment plan. All questions answered to patient's satisfaction.    Hilbert Bible, [place]

## 2023-03-05 ENCOUNTER — Encounter (HOSPITAL_BASED_OUTPATIENT_CLINIC_OR_DEPARTMENT_OTHER): Payer: Self-pay | Admitting: Family Medicine

## 2023-03-05 LAB — CYTOLOGY - PAP
Adequacy: ABSENT
Comment: NEGATIVE
Diagnosis: NEGATIVE
High risk HPV: NEGATIVE

## 2023-03-05 NOTE — Progress Notes (Signed)
 Rebecca Hernandez, Your pap was normal. I recommend repeating your pap in 1 year due to missing sample of an area called the transformation zone. This can be common as you approach menopause. Your HPV testing was negative. If you have further questions, please let me know.

## 2023-03-12 ENCOUNTER — Other Ambulatory Visit: Payer: Self-pay

## 2023-03-12 DIAGNOSIS — E119 Type 2 diabetes mellitus without complications: Secondary | ICD-10-CM

## 2023-03-15 ENCOUNTER — Other Ambulatory Visit

## 2023-03-16 LAB — COMPREHENSIVE METABOLIC PANEL
AG Ratio: 1.5 (calc) (ref 1.0–2.5)
ALT: 22 U/L (ref 6–29)
AST: 18 U/L (ref 10–35)
Albumin: 4 g/dL (ref 3.6–5.1)
Alkaline phosphatase (APISO): 77 U/L (ref 37–153)
BUN: 14 mg/dL (ref 7–25)
CO2: 27 mmol/L (ref 20–32)
Calcium: 9.3 mg/dL (ref 8.6–10.4)
Chloride: 106 mmol/L (ref 98–110)
Creat: 0.68 mg/dL (ref 0.50–1.03)
Globulin: 2.6 g/dL (ref 1.9–3.7)
Glucose, Bld: 118 mg/dL — ABNORMAL HIGH (ref 65–99)
Potassium: 4.7 mmol/L (ref 3.5–5.3)
Sodium: 138 mmol/L (ref 135–146)
Total Bilirubin: 0.6 mg/dL (ref 0.2–1.2)
Total Protein: 6.6 g/dL (ref 6.1–8.1)

## 2023-03-16 LAB — HEMOGLOBIN A1C
Hgb A1c MFr Bld: 7.4 %{Hb} — ABNORMAL HIGH (ref <5.7)
Mean Plasma Glucose: 166 mg/dL
eAG (mmol/L): 9.2 mmol/L

## 2023-03-20 ENCOUNTER — Ambulatory Visit (INDEPENDENT_AMBULATORY_CARE_PROVIDER_SITE_OTHER): Admitting: "Endocrinology

## 2023-03-20 ENCOUNTER — Encounter: Payer: Self-pay | Admitting: "Endocrinology

## 2023-03-20 VITALS — BP 103/70 | Ht 66.0 in | Wt 206.6 lb

## 2023-03-20 DIAGNOSIS — E7800 Pure hypercholesterolemia, unspecified: Secondary | ICD-10-CM

## 2023-03-20 DIAGNOSIS — E119 Type 2 diabetes mellitus without complications: Secondary | ICD-10-CM | POA: Diagnosis not present

## 2023-03-20 DIAGNOSIS — Z7985 Long-term (current) use of injectable non-insulin antidiabetic drugs: Secondary | ICD-10-CM

## 2023-03-20 DIAGNOSIS — Z7984 Long term (current) use of oral hypoglycemic drugs: Secondary | ICD-10-CM | POA: Diagnosis not present

## 2023-03-20 MED ORDER — TRULICITY 4.5 MG/0.5ML ~~LOC~~ SOAJ: 4.5000 mg | SUBCUTANEOUS | 3 refills | Status: AC

## 2023-03-20 MED ORDER — ATORVASTATIN CALCIUM 10 MG PO TABS
10.0000 mg | ORAL_TABLET | Freq: Every day | ORAL | 3 refills | Status: DC
Start: 2023-03-20 — End: 2023-07-31

## 2023-03-20 NOTE — Patient Instructions (Signed)

## 2023-03-20 NOTE — Progress Notes (Signed)
 Outpatient Endocrinology Note Rebecca Tappan, MD  03/20/23   Rebecca Hernandez 10-10-1970 [id number]  Referring Provider: de [place], Raymond J, MD Primary Care Provider: de [place], Raymond J, MD Reason for consultation: Subjective   Assessment & Plan  Diagnoses and all orders for this visit:  Controlled type 2 diabetes mellitus without complication, without long-term current use of insulin (HCC) -     Microalbumin / creatinine urine ratio  Long term (current) use of oral hypoglycemic drugs  Long-term (current) use of injectable non-insulin antidiabetic drugs  Pure hypercholesterolemia  Other orders -     Dulaglutide (TRULICITY) 4.5 MG/0.5ML SOAJ; Inject 4.5 mg as directed once a week. -     atorvastatin (LIPITOR) 10 MG tablet; Take 1 tablet (10 mg total) by mouth daily.   Diabetes Type II complicated by neuropathy  Lab Results  Component Value Date   GFR 103.82 07/12/2022   Hba1c goal less than 7, current Hba1c is 7.1% Lab Results  Component Value Date   HGBA1C 7.4 (H) 03/15/2023   Will recommend the following: Synjardy 12.5/1000mg  bid  Trucility 4.5 mg weekly Humalog sliding scale 1:50>200, starting at 2 units  Correction scale: Use Humalog insulin based on blood sugars as follows: 201 - 250: 2 units 251 - 300: 3 units 301 - 350: 4 units 351 - 400: 5 units  Take Humalog at least 4 hours apart  On DexCom   No known contraindications to any of above medications  12/27/2022 fasting LDL 103 Tg 85 Chol 180 GFR 109 AST 20 ALT 20  Lab Results  Component Value Date   LDLCALC 104 (H) 02/16/2023    Lab Results  Component Value Date   TRIG 70 02/16/2023   -start atorvastatin 10 gm every day (on 03/20/23) -Follow low fat diet and exercise   -Blood pressure goal <140/90 - Microalbumin/creatinine goal < 30 -Last MA/Cr is as follows: Lab Results  Component Value Date   MICROALBUR <0.7 07/12/2022   -not on ACE/ARB  -diet changes including salt  restriction -limit eating outside -counseled BP targets per standards of diabetes care -uncontrolled blood pressure can lead to retinopathy, nephropathy and cardiovascular and atherosclerotic heart disease  Reviewed and counseled on: -A1C target -Blood sugar targets -Complications of uncontrolled diabetes  -Checking blood sugar before meals and bedtime and bring log next visit -All medications with mechanism of action and side effects -Hypoglycemia management: rule of 15's, Glucagon Emergency Kit and medical alert ID -low-carb low-fat plate-method diet -At least 20 minutes of physical activity per day -Annual dilated retinal eye exam and foot exam -compliance and follow up needs -follow up as scheduled or earlier if problem gets worse  Call if blood sugar is less than 70 or consistently above 250    Take a 15 gm snack of carbohydrate at bedtime before you go to sleep if your blood sugar is less than 100.    If you are going to fast after midnight for a test or procedure, ask your physician for instructions on how to reduce/decrease your insulin dose.    Call if blood sugar is less than 70 or consistently above 250  -Treating a low sugar by rule of 15  (15 gms of sugar every 15 min until sugar is more than 70) If you feel your sugar is low, test your sugar to be sure If your sugar is low (less than 70), then take 15 grams of a fast acting Carbohydrate (3-4 glucose tablets or glucose gel or  4 ounces of juice or regular soda) Recheck your sugar 15 min after treating low to make sure it is more than 70 If sugar is still less than 70, treat again with 15 grams of carbohydrate          Don't drive the hour of hypoglycemia  If unconscious/unable to eat or drink by mouth, use glucagon injection or nasal spray baqsimi and call 911. Can repeat again in 15 min if still unconscious.  Return in about 4 months (around 07/20/2023) for visit and 8 am labs before next visit.   I have reviewed  current medications, nurse's notes, allergies, vital signs, past medical and surgical history, family medical history, and social history for this encounter. Counseled patient on symptoms, examination findings, lab findings, imaging results, treatment decisions and monitoring and prognosis. The patient understood the recommendations and agrees with the treatment plan. All questions regarding treatment plan were fully answered.  Rebecca [place], MD  03/20/23   History of Present Illness Rebecca Hernandez is a 53 y.o. year old female who presents for follow up of Type II diabetes mellitus.  Rebecca Hernandez was first diagnosed in 2023.   Diabetes education +  Home diabetes regimen: Synjady 12.05/998 mg bid  Trucility 3 mg weekly   COMPLICATIONS -  MI/Stroke -  retinopathy + neuropathy -  nephropathy  BLOOD SUGAR DATA  CGM interpretation: At today's visit, we reviewed her CGM downloads. The full report is scanned in the media. Reviewing the CGM trends, BG are well controlled across the day.   Physical Exam  BP 103/70 (BP Location: Right Arm, Patient Position: Sitting)   Ht 5\' 6"  (1.676 m)   Wt 206 lb 9.6 oz (93.7 kg)   LMP 02/19/2023 (Approximate)   SpO2 98%   BMI 33.35 kg/m    Constitutional: well developed, well nourished Head: normocephalic, atraumatic Eyes: sclera anicteric, no redness Neck: supple Lungs: normal respiratory effort Neurology: alert and oriented Skin: dry, no appreciable rashes Musculoskeletal: no appreciable defects Psychiatric: normal mood and affect Diabetic Foot Exam - Simple   No data filed      Current Medications Patient's Medications  New Prescriptions   ATORVASTATIN (LIPITOR) 10 MG TABLET    Take 1 tablet (10 mg total) by mouth daily.   DULAGLUTIDE (TRULICITY) 4.5 MG/0.5ML SOAJ    Inject 4.5 mg as directed once a week.  Previous Medications   CONTINUOUS GLUCOSE SENSOR (DEXCOM G7 SENSOR) MISC    Inject 1 Device into the skin every  10 days.   CONTINUOUS GLUCOSE SENSOR (FREESTYLE LIBRE 3 PLUS SENSOR) MISC    Change sensor every 15 days.   EMPAGLIFLOZIN-METFORMIN HCL (SYNJARDY) 12.05-998 MG TABS    Take 1 tablet by mouth 2 (two) times daily.   INSULIN LISPRO (HUMALOG KWIKPEN) 100 UNIT/ML KWIKPEN    Based on sliding scale, max dose 25 units/day   INSULIN PEN NEEDLE (PEN NEEDLES) 32G X 4 MM MISC    1 Device by Does not apply route in the morning, at noon, in the evening, and at bedtime.  Modified Medications   No medications on file  Discontinued Medications   DULAGLUTIDE (TRULICITY) 3 MG/0.5ML SOAJ    Inject 3 mg as directed once a week.    Allergies Allergies  Allergen Reactions   Dilaudid [Hydromorphone Hcl] Shortness Of Breath    Disoriented. Burning sensation constant.    Past Medical History Past Medical History:  Diagnosis Date   Bell's palsy    Less than 7 years  old    Diabetes (HCC)    Eczema    GERD (gastroesophageal reflux disease)    Left-sided face pain    Migraine headache     Past Surgical History Past Surgical History:  Procedure Laterality Date   BIOPSY N/A 03/27/2016   Procedure: BIOPSY small bowel;  Surgeon: West Bali, MD;  [facility];  Service: Endoscopy;  Laterality: N/A;  small bowel biopsy   CHOLECYSTECTOMY N/A last year sept. 2015   COLONOSCOPY N/A 03/27/2016   Procedure: COLONOSCOPY;  Surgeon: West Bali, MD;  [facility];  Service: Endoscopy;  Laterality: N/A;  8:30am   ESOPHAGOGASTRODUODENOSCOPY N/A 03/27/2016   Procedure: ESOPHAGOGASTRODUODENOSCOPY (EGD);  Surgeon: West Bali, MD;  [facility];  Service: Endoscopy;  Laterality: N/A;   LAPAROSCOPIC ENDOMETRIOSIS FULGURATION      Family History family history includes Diabetes in her maternal uncle; Healthy in her father and mother; Heart attack in her maternal grandmother.  Social History Social History   Socioeconomic History   Marital status: Married    Spouse name: Not on  file   Number of children: 0   Years of education: Not on file   Highest education level: Not on file  Occupational History   Occupation: Technical sales engineer of [employer]  Tobacco Use   Smoking status: Never    Passive exposure: Never   Smokeless tobacco: Never   Tobacco comments:    Never smoked  Substance and Sexual Activity   Alcohol use: Yes    Alcohol/week: 1.0 standard drink of alcohol    Types: 1 Glasses of wine per week    Comment: rare   Drug use: No   Sexual activity: Not on file  Other Topics Concern   Not on file  Social History Narrative   She is from [place]. Husband is in the [employer].    Right handed   Lives at home with husband    Caffeine~ 1 cup per day    Social Drivers of Health   Financial Resource Strain: Not on file  Food Insecurity: Not on file  Transportation Needs: Not on file  Physical Activity: Not on file  Stress: Not on file  Social Connections: Not on file  Intimate Partner Violence: Not on file    Lab Results  Component Value Date   HGBA1C 7.4 (H) 03/15/2023   HGBA1C 7.1 (A) 01/08/2023   HGBA1C 9.1 (H) 07/12/2022   Lab Results  Component Value Date   CHOL 165 02/16/2023   Lab Results  Component Value Date   HDL 47 02/16/2023   Lab Results  Component Value Date   LDLCALC 104 (H) 02/16/2023   Lab Results  Component Value Date   TRIG 70 02/16/2023   Lab Results  Component Value Date   CHOLHDL 3.5 02/16/2023   Lab Results  Component Value Date   CREATININE 0.68 03/15/2023   Lab Results  Component Value Date   GFR 103.82 07/12/2022   Lab Results  Component Value Date   MICROALBUR <0.7 07/12/2022      Component Value Date/Time   NA 138 03/15/2023 1053   NA 138 02/16/2023 0923   K 4.7 03/15/2023 1053   CL 106 03/15/2023 1053   CO2 27 03/15/2023 1053   GLUCOSE 118 (H) 03/15/2023 1053   BUN 14 03/15/2023 1053   BUN 11 02/16/2023 0923   CREATININE 0.68 03/15/2023 1053   CALCIUM 9.3 03/15/2023 1053   PROT 6.6  03/15/2023 1053   PROT 6.7 02/16/2023 0923   ALBUMIN 4.0 02/16/2023 0923   AST 18 03/15/2023 1053   ALT 22 03/15/2023 1053   ALKPHOS 95 02/16/2023 0923   BILITOT 0.6 03/15/2023 1053   BILITOT 0.4 02/16/2023 0923   GFRNONAA 80 09/17/2018 0943   GFRAA 92 09/17/2018 0943      Latest Ref Rng & Units 03/15/2023   10:53 AM 02/16/2023    9:23 AM 07/12/2022    9:10 AM  BMP  Glucose 65 - 99 mg/dL 161  096  045   BUN 7 - 25 mg/dL 14  11  13    Creatinine 0.50 - 1.03 mg/dL 4.09  8.11  9.14   BUN/Creat Ratio 6 - 22 (calc) SEE NOTE:  15    Sodium 135 - 146 mmol/L 138  138  133   Potassium 3.5 - 5.3 mmol/L 4.7  4.6  4.2   Chloride 98 - 110 mmol/L 106  104  102   CO2 20 - 32 mmol/L 27  22  23    Calcium 8.6 - 10.4 mg/dL 9.3  9.1  8.7        Component Value Date/Time   WBC 6.4 02/16/2023 0923   WBC 5.5 09/03/2013 1028   RBC 4.37 02/16/2023 0923   RBC 4.4 09/03/2013 1028   HGB 11.9 02/16/2023 0923   HCT 37.4 02/16/2023 0923   PLT 358 02/16/2023 0923   MCV 86 02/16/2023 0923   MCH 27.2 02/16/2023 0923   MCH 26.7 (A) 09/03/2013 1028   MCHC 31.8 02/16/2023 0923   MCHC 31.7 (A) 09/03/2013 1028   RDW 13.6 02/16/2023 0923   LYMPHSABS 2.2 02/16/2023 0923   EOSABS 0.2 02/16/2023 0923   BASOSABS 0.1 02/16/2023 0923     Parts of this note may have been dictated using voice recognition software. There may be variances in spelling and vocabulary which are unintentional. Not all errors are proofread. Please notify the Thereasa Parkin if any discrepancies are noted or if the meaning of any statement is not clear.

## 2023-03-22 ENCOUNTER — Encounter: Payer: Self-pay | Admitting: "Endocrinology

## 2023-03-22 MED ORDER — VITAMIN D (ERGOCALCIFEROL) 1.25 MG (50000 UNIT) PO CAPS: 50000.0000 [IU] | ORAL_CAPSULE | ORAL | 0 refills | Status: DC

## 2023-03-23 ENCOUNTER — Encounter: Payer: Self-pay | Admitting: "Endocrinology

## 2023-04-05 ENCOUNTER — Encounter (HOSPITAL_BASED_OUTPATIENT_CLINIC_OR_DEPARTMENT_OTHER): Payer: Self-pay | Admitting: Family Medicine

## 2023-04-05 ENCOUNTER — Ambulatory Visit (INDEPENDENT_AMBULATORY_CARE_PROVIDER_SITE_OTHER): Admitting: Family Medicine

## 2023-04-05 VITALS — BP 123/79 | HR 84 | Ht 66.0 in | Wt 206.3 lb

## 2023-04-05 DIAGNOSIS — Z0000 Encounter for general adult medical examination without abnormal findings: Secondary | ICD-10-CM | POA: Diagnosis not present

## 2023-04-05 DIAGNOSIS — E559 Vitamin D deficiency, unspecified: Secondary | ICD-10-CM | POA: Diagnosis not present

## 2023-04-05 NOTE — Assessment & Plan Note (Signed)
 Routine HCM labs reviewed. HCM reviewed/discussed. Anticipatory guidance regarding healthy weight, lifestyle and choices given. Recommend healthy diet.  Recommend approximately 150 minutes/week of moderate intensity exercise Recommend regular dental and vision exams Always use seatbelt/lap and shoulder restraints Recommend using smoke alarms and checking batteries at least twice a year Recommend using sunscreen when outside Discussed colon cancer screening recommendations, options.  Patient is UTD Discussed recommendations for shingles vaccine.  Patient will consider Discussed immunization recommendations

## 2023-04-05 NOTE — Progress Notes (Signed)
 Subjective:    CC: Annual Physical Exam  HPI: Rebecca Hernandez is a 53 y.o. presenting for annual physical  I reviewed the past medical history, family history, social history, surgical history, and allergies today and no changes were needed.  Please see the problem list section below in epic for further details.  Past Medical History: Past Medical History:  Diagnosis Date   Bell's palsy    Less than 7 years old    Diabetes (HCC)    Eczema    GERD (gastroesophageal reflux disease)    Left-sided face pain    Migraine headache    Past Surgical History: Past Surgical History:  Procedure Laterality Date   BIOPSY N/A 03/27/2016   Procedure: BIOPSY small bowel;  Surgeon: West Bali, MD;  [facility];  Service: Endoscopy;  Laterality: N/A;  small bowel biopsy   CHOLECYSTECTOMY N/A last year sept. 2015   COLONOSCOPY N/A 03/27/2016   Procedure: COLONOSCOPY;  Surgeon: West Bali, MD;  [facility];  Service: Endoscopy;  Laterality: N/A;  8:30am   ESOPHAGOGASTRODUODENOSCOPY N/A 03/27/2016   Procedure: ESOPHAGOGASTRODUODENOSCOPY (EGD);  Surgeon: West Bali, MD;  [facility];  Service: Endoscopy;  Laterality: N/A;   LAPAROSCOPIC ENDOMETRIOSIS FULGURATION     Social History: Social History   Socioeconomic History   Marital status: Married    Spouse name: Not on file   Number of children: 0   Years of education: Not on file   Highest education level: Not on file  Occupational History   Occupation: Technical sales engineer of [employer]  Tobacco Use   Smoking status: Never    Passive exposure: Never   Smokeless tobacco: Never   Tobacco comments:    Never smoked  Vaping Use   Vaping status: Never Used  Substance and Sexual Activity   Alcohol use: Yes    Alcohol/week: 1.0 standard drink of alcohol    Types: 1 Glasses of wine per week    Comment: rare   Drug use: No   Sexual activity: Not on file  Other Topics Concern   Not on file  Social  History Narrative   She is from [place]. Husband is in the [employer].    Right handed   Lives at home with husband    Caffeine~ 1 cup per day    Social Drivers of Health   Financial Resource Strain: Not on file  Food Insecurity: Not on file  Transportation Needs: Not on file  Physical Activity: Not on file  Stress: Not on file  Social Connections: Not on file   Family History: Family History  Problem Relation Age of Onset   Healthy Mother    Healthy Father    Heart attack Maternal Grandmother    Diabetes Maternal Uncle    Allergic rhinitis Neg Hx    Angioedema Neg Hx    Asthma Neg Hx    Atopy Neg Hx    Eczema Neg Hx    Immunodeficiency Neg Hx    Urticaria Neg Hx    Celiac disease Neg Hx    Colon cancer Neg Hx    Allergies: Allergies  Allergen Reactions   Dilaudid [Hydromorphone Hcl] Shortness Of Breath    Disoriented. Burning sensation constant.   Medications: See med rec.  Review of Systems: No headache, visual changes, nausea, vomiting, diarrhea, constipation, dizziness, abdominal pain, skin rash, fevers, chills, night sweats, swollen lymph nodes, weight loss, chest pain, body aches, joint swelling, muscle  aches, shortness of breath, mood changes, visual or auditory hallucinations.  Objective:    BP 123/79 (BP Location: Right Arm, Patient Position: Sitting, Cuff Size: Normal)   Pulse 84   Ht 5\' 6"  (1.676 m)   Wt 206 lb 4.8 oz (93.6 kg)   SpO2 95%   BMI 33.30 kg/m   General: Well Developed, well nourished, and in no acute distress.  Neuro: Alert and oriented x3, extra-ocular muscles intact, sensation grossly intact. Cranial nerves II through XII are intact, motor, sensory, and coordinative functions are all intact. HEENT: Normocephalic, atraumatic, pupils equal round reactive to light, neck supple, no masses, no lymphadenopathy, thyroid nonpalpable. Oropharynx, nasopharynx, external ear canals are unremarkable. Skin: Warm and dry, no rashes noted.  Cardiac:  Regular rate and rhythm, no murmurs rubs or gallops.  Respiratory: Clear to auscultation bilaterally. Not using accessory muscles, speaking in full sentences.  Abdominal: Soft, nontender, nondistended, positive bowel sounds, no masses, no organomegaly.  Musculoskeletal: Shoulder, elbow, wrist, hip, knee, ankle stable, and with full range of motion.  Impression and Recommendations:    Wellness examination Assessment & Plan: Routine HCM labs reviewed. HCM reviewed/discussed. Anticipatory guidance regarding healthy weight, lifestyle and choices given. Recommend healthy diet.  Recommend approximately 150 minutes/week of moderate intensity exercise Recommend regular dental and vision exams Always use seatbelt/lap and shoulder restraints Recommend using smoke alarms and checking batteries at least twice a year Recommend using sunscreen when outside Discussed colon cancer screening recommendations, options.  Patient is UTD Discussed recommendations for shingles vaccine.  Patient will consider Discussed immunization recommendations   Vitamin D deficiency Assessment & Plan: Recently started on high-dose of vitamin D supplement. Plan to continue with this and recheck vitamin D level in about 2-3 months to assess response.  Orders: -     VITAMIN D 25 Hydroxy (Vit-D Deficiency, Fractures); Future  Return in about 6 months (around 10/06/2023).   ___________________________________________ Rebecca Cutrona de [place], MD, ABFM, CAQSM Primary Care and Sports Medicine [facility]

## 2023-04-05 NOTE — Patient Instructions (Signed)
  Medication Instructions:  Your physician recommends that you continue on your current medications as directed. Please refer to the Current Medication list given to you today. --If you need a refill on any your medications before your next appointment, please call your pharmacy first. If no refills are authorized on file call the office.-- Lab Work: Your physician has recommended that you have lab work today: 6- 8 weeks for vitamin d   If you have labs (blood work) drawn today and your tests are completely normal, you will receive your results via MyChart message OR a phone call from our staff.  Please ensure you check your voicemail in the event that you authorized detailed messages to be left on a delegated number. If you have any lab test that is abnormal or we need to change your treatment, we will call you to review the results.  Follow-Up: Your next appointment:   Your physician recommends that you schedule a follow-up appointment in: 6 month follow up  with Dr. de Peru  You will receive a text message or e-mail with a link to a survey about your care and experience with [place] today! We would greatly appreciate your feedback!   Thanks for letting us be apart of your health journey!!  Primary Care and Sports Medicine   Dr. Ceasar Mons Peru   We encourage you to activate your patient portal called "MyChart".  Sign up information is provided on this After Visit Summary.  MyChart is used to connect with patients for Virtual Visits (Telemedicine).  Patients are able to view lab/test results, encounter notes, upcoming appointments, etc.  Non-urgent messages can be sent to your provider as well. To learn more about what you can do with MyChart, please visit --  [email or web address].

## 2023-04-05 NOTE — Assessment & Plan Note (Signed)
 Recently started on high-dose of vitamin D supplement. Plan to continue with this and recheck vitamin D level in about 2-3 months to assess response.

## 2023-05-15 ENCOUNTER — Other Ambulatory Visit (HOSPITAL_BASED_OUTPATIENT_CLINIC_OR_DEPARTMENT_OTHER): Payer: Self-pay | Admitting: Family Medicine

## 2023-07-15 ENCOUNTER — Other Ambulatory Visit (HOSPITAL_BASED_OUTPATIENT_CLINIC_OR_DEPARTMENT_OTHER): Payer: Self-pay | Admitting: Family Medicine

## 2023-07-23 ENCOUNTER — Other Ambulatory Visit

## 2023-07-24 LAB — MICROALBUMIN / CREATININE URINE RATIO
Creatinine, Urine: 280 mg/dL — ABNORMAL HIGH (ref 20–275)
Microalb Creat Ratio: 10 mg/g{creat} (ref <30)
Microalb, Ur: 2.9 mg/dL

## 2023-07-27 ENCOUNTER — Encounter: Payer: Self-pay | Admitting: "Endocrinology

## 2023-07-27 ENCOUNTER — Ambulatory Visit (INDEPENDENT_AMBULATORY_CARE_PROVIDER_SITE_OTHER): Admitting: "Endocrinology

## 2023-07-27 VITALS — BP 124/70 | HR 92 | Ht 66.0 in | Wt 207.0 lb

## 2023-07-27 DIAGNOSIS — E7800 Pure hypercholesterolemia, unspecified: Secondary | ICD-10-CM | POA: Diagnosis not present

## 2023-07-27 DIAGNOSIS — Z7984 Long term (current) use of oral hypoglycemic drugs: Secondary | ICD-10-CM | POA: Diagnosis not present

## 2023-07-27 DIAGNOSIS — E1165 Type 2 diabetes mellitus with hyperglycemia: Secondary | ICD-10-CM

## 2023-07-27 DIAGNOSIS — Z7985 Long-term (current) use of injectable non-insulin antidiabetic drugs: Secondary | ICD-10-CM

## 2023-07-27 LAB — POCT GLYCOSYLATED HEMOGLOBIN (HGB A1C): Hemoglobin A1C: 7 % — AB (ref 4.0–5.6)

## 2023-07-27 NOTE — Progress Notes (Signed)
 Outpatient Endocrinology Note Rebecca Birmingham, MD  07/27/23   Rebecca Hernandez 1970-05-22 [id number]  Referring Provider: de Peru, Raymond J, MD Primary Care Provider: de Peru, Raymond J, MD Reason for consultation: Subjective   Assessment & Plan  Diagnoses and all orders for this visit:  Uncontrolled type 2 diabetes mellitus with hyperglycemia (HCC) -     POCT glycosylated hemoglobin (Hb A1C) -     Lipid panel  Long term (current) use of oral hypoglycemic drugs  Long-term (current) use of injectable non-insulin  antidiabetic drugs  Pure hypercholesterolemia   Diabetes Type II complicated by neuropathy  Lab Results  Component Value Date   GFR 103.82 07/12/2022   Hba1c goal less than 7, current Hba1c is 7.1% Lab Results  Component Value Date   HGBA1C 7.0 (A) 07/27/2023   Will recommend the following: Synjardy  12.5/1000mg  bid  Trucility 4.5 mg weekly Humalog  sliding scale 1:50>200, starting at 2 units  Correction scale: Use Humalog  insulin  based on blood sugars as follows: 201 - 250: 2 units 251 - 300: 3 units 301 - 350: 4 units 351 - 400: 5 units  Take Humalog  at least 4 hours apart  On DexCom   No known contraindications to any of above medications  12/27/2022 fasting LDL 103 Tg 85 Chol 180 GFR 109 AST 20 ALT 20  Lab Results  Component Value Date   LDLCALC 104 (H) 02/16/2023    Lab Results  Component Value Date   TRIG 70 02/16/2023   -start atorvastatin  10 mg every day (on 03/20/23) -Follow low fat diet and exercise   -Blood pressure goal <140/90 - Microalbumin/creatinine goal < 30 -Last MA/Cr is as follows: Lab Results  Component Value Date   MICROALBUR 2.9 07/23/2023   -not on ACE/ARB  -diet changes including salt restriction -limit eating outside -counseled BP targets per standards of diabetes care -uncontrolled blood pressure can lead to retinopathy, nephropathy and cardiovascular and atherosclerotic heart disease  Reviewed  and counseled on: -A1C target -Blood sugar targets -Complications of uncontrolled diabetes  -Checking blood sugar before meals and bedtime and bring log next visit -All medications with mechanism of action and side effects -Hypoglycemia management: rule of 15's, Glucagon Emergency Kit and medical alert ID -low-carb low-fat plate-method diet -At least 20 minutes of physical activity per day -Annual dilated retinal eye exam and foot exam -compliance and follow up needs -follow up as scheduled or earlier if problem gets worse  Call if blood sugar is less than 70 or consistently above 250    Take a 15 gm snack of carbohydrate at bedtime before you go to sleep if your blood sugar is less than 100.    If you are going to fast after midnight for a test or procedure, ask your physician for instructions on how to reduce/decrease your insulin  dose.    Call if blood sugar is less than 70 or consistently above 250  -Treating a low sugar by rule of 15  (15 gms of sugar every 15 min until sugar is more than 70) If you feel your sugar is low, test your sugar to be sure If your sugar is low (less than 70), then take 15 grams of a fast acting Carbohydrate (3-4 glucose tablets or glucose gel or 4 ounces of juice or regular soda) Recheck your sugar 15 min after treating low to make sure it is more than 70 If sugar is still less than 70, treat again with 15 grams of carbohydrate  Don't drive the hour of hypoglycemia  If unconscious/unable to eat or drink by mouth, use glucagon injection or nasal spray baqsimi and call 911. Can repeat again in 15 min if still unconscious.  Return in about 3 months (around 10/27/2023) for visit, labs today.   I have reviewed current medications, nurse's notes, allergies, vital signs, past medical and surgical history, family medical history, and social history for this encounter. Counseled patient on symptoms, examination findings, lab findings, imaging results,  treatment decisions and monitoring and prognosis. The patient understood the recommendations and agrees with the treatment plan. All questions regarding treatment plan were fully answered.  Rebecca Birmingham, MD  07/27/23   History of Present Illness Rebecca Hernandez is a 53 y.o. year old female who presents for follow up of Type II diabetes mellitus.  Rebecca Hernandez was first diagnosed in 2023.   Diabetes education +  Home diabetes regimen: Synjardy  12.5/1000mg  bid  Trucility 4.5 mg weekly Humalog  sliding scale 1:50>200, starting at 2 units  Correction scale: Use Humalog  insulin  based on blood sugars as follows: 201 - 250: 2 units 251 - 300: 3 units 301 - 350: 4 units 351 - 400: 5 units  Take Humalog  at least 4 hours apart  COMPLICATIONS -  MI/Stroke -  retinopathy + neuropathy -  nephropathy  BLOOD SUGAR DATA CGM interpretation: At today's visit, we reviewed her CGM downloads. The full report is scanned in the media. Reviewing the CGM trends, BG are well controlled with some highs, however the data is scant.   Physical Exam  BP 124/70   Pulse 92   Ht 5' 6 (1.676 m)   Wt 207 lb (93.9 kg)   SpO2 98%   BMI 33.41 kg/m    Constitutional: well developed, well nourished Head: normocephalic, atraumatic Eyes: sclera anicteric, no redness Neck: supple Lungs: normal respiratory effort Neurology: alert and oriented Skin: dry, no appreciable rashes Musculoskeletal: no appreciable defects Psychiatric: normal mood and affect Diabetic Foot Exam - Simple   No data filed      Current Medications Patient's Medications  New Prescriptions   No medications on file  Previous Medications   ATORVASTATIN  (LIPITOR) 10 MG TABLET    Take 1 tablet (10 mg total) by mouth daily.   CONTINUOUS GLUCOSE SENSOR (DEXCOM G7 SENSOR) MISC    Inject 1 Device into the skin every 10 days.   DULAGLUTIDE  (TRULICITY ) 4.5 MG/0.5ML SOAJ    Inject 4.5 mg as directed once a week.    EMPAGLIFLOZIN -METFORMIN  HCL (SYNJARDY ) 12.05-998 MG TABS    Take 1 tablet by mouth 2 (two) times daily.   INSULIN  LISPRO (HUMALOG  KWIKPEN) 100 UNIT/ML KWIKPEN    Based on sliding scale, max dose 25 units/day   INSULIN  PEN NEEDLE (PEN NEEDLES) 32G X 4 MM MISC    1 Device by Does not apply route in the morning, at noon, in the evening, and at bedtime.   VITAMIN D , ERGOCALCIFEROL , (DRISDOL ) 1.25 MG (50000 UNIT) CAPS CAPSULE    TAKE 1 CAPSULE (50,000 UNITS TOTAL) BY MOUTH EVERY 7 (SEVEN) DAYS. TAKE FOR 8 TOTAL DOSES(WEEKS)  Modified Medications   No medications on file  Discontinued Medications   No medications on file    Allergies Allergies  Allergen Reactions   Dilaudid [Hydromorphone Hcl] Shortness Of Breath    Disoriented. Burning sensation constant.    Past Medical History Past Medical History:  Diagnosis Date   Bell's palsy    Less than 89 years old  Diabetes (HCC)    Eczema    GERD (gastroesophageal reflux disease)    Left-sided face pain    Migraine headache     Past Surgical History Past Surgical History:  Procedure Laterality Date   BIOPSY N/A 03/27/2016   Procedure: BIOPSY small bowel;  Surgeon: Margo LITTIE Haddock, MD;  Location: [facility];  Service: Endoscopy;  Laterality: N/A;  small bowel biopsy   CHOLECYSTECTOMY N/A last year sept. 2015   COLONOSCOPY N/A 03/27/2016   Procedure: COLONOSCOPY;  Surgeon: Margo LITTIE Haddock, MD;  Location: [facility];  Service: Endoscopy;  Laterality: N/A;  8:30am   ESOPHAGOGASTRODUODENOSCOPY N/A 03/27/2016   Procedure: ESOPHAGOGASTRODUODENOSCOPY (EGD);  Surgeon: Margo LITTIE Haddock, MD;  Location: [facility];  Service: Endoscopy;  Laterality: N/A;   LAPAROSCOPIC ENDOMETRIOSIS FULGURATION      Family History family history includes Diabetes in her maternal uncle; Healthy in her father and mother; Heart attack in her maternal grandmother.  Social History Social History   Socioeconomic History   Marital status: Married    Spouse name:  Not on file   Number of children: 0   Years of education: Not on file   Highest education level: Not on file  Occupational History   Occupation: Technical sales engineer of [employer]  Tobacco Use   Smoking status: Never    Passive exposure: Never   Smokeless tobacco: Never   Tobacco comments:    Never smoked  Vaping Use   Vaping status: Never Used  Substance and Sexual Activity   Alcohol use: Yes    Alcohol/week: 1.0 standard drink of alcohol    Types: 1 Glasses of wine per week    Comment: rare   Drug use: No   Sexual activity: Not on file  Other Topics Concern   Not on file  Social History Narrative   She is from [place]. Husband is in the [employer].    Right handed   Lives at home with husband    Caffeine~ 1 cup per day    Social Drivers of Health   Financial Resource Strain: Not on file  Food Insecurity: Not on file  Transportation Needs: Not on file  Physical Activity: Not on file  Stress: Not on file  Social Connections: Not on file  Intimate Partner Violence: Not on file    Lab Results  Component Value Date   HGBA1C 7.0 (A) 07/27/2023   HGBA1C 7.4 (H) 03/15/2023   HGBA1C 7.1 (A) 01/08/2023   Lab Results  Component Value Date   CHOL 165 02/16/2023   Lab Results  Component Value Date   HDL 47 02/16/2023   Lab Results  Component Value Date   LDLCALC 104 (H) 02/16/2023   Lab Results  Component Value Date   TRIG 70 02/16/2023   Lab Results  Component Value Date   CHOLHDL 3.5 02/16/2023   Lab Results  Component Value Date   CREATININE 0.68 03/15/2023   Lab Results  Component Value Date   GFR 103.82 07/12/2022   Lab Results  Component Value Date   MICROALBUR 2.9 07/23/2023      Component Value Date/Time   NA 138 03/15/2023 1053   NA 138 02/16/2023 0923   K 4.7 03/15/2023 1053   CL 106 03/15/2023 1053   CO2 27 03/15/2023 1053   GLUCOSE 118 (H) 03/15/2023 1053   BUN 14 03/15/2023 1053   BUN 11 02/16/2023 0923   CREATININE 0.68 03/15/2023 1053    CALCIUM  9.3  03/15/2023 1053   PROT 6.6 03/15/2023 1053   PROT 6.7 02/16/2023 0923   ALBUMIN 4.0 02/16/2023 0923   AST 18 03/15/2023 1053   ALT 22 03/15/2023 1053   ALKPHOS 95 02/16/2023 0923   BILITOT 0.6 03/15/2023 1053   BILITOT 0.4 02/16/2023 0923   GFRNONAA 80 09/17/2018 0943   GFRAA 92 09/17/2018 0943      Latest Ref Rng & Units 03/15/2023   10:53 AM 02/16/2023    9:23 AM 07/12/2022    9:10 AM  BMP  Glucose 65 - 99 mg/dL 881  826  712   BUN 7 - 25 mg/dL 14  11  13    Creatinine 0.50 - 1.03 mg/dL 9.31  9.28  9.38   BUN/Creat Ratio 6 - 22 (calc) SEE NOTE:  15    Sodium 135 - 146 mmol/L 138  138  133   Potassium 3.5 - 5.3 mmol/L 4.7  4.6  4.2   Chloride 98 - 110 mmol/L 106  104  102   CO2 20 - 32 mmol/L 27  22  23    Calcium  8.6 - 10.4 mg/dL 9.3  9.1  8.7        Component Value Date/Time   WBC 6.4 02/16/2023 0923   WBC 5.5 09/03/2013 1028   RBC 4.37 02/16/2023 0923   RBC 4.4 09/03/2013 1028   HGB 11.9 02/16/2023 0923   HCT 37.4 02/16/2023 0923   PLT 358 02/16/2023 0923   MCV 86 02/16/2023 0923   MCH 27.2 02/16/2023 0923   MCH 26.7 (A) 09/03/2013 1028   MCHC 31.8 02/16/2023 0923   MCHC 31.7 (A) 09/03/2013 1028   RDW 13.6 02/16/2023 0923   LYMPHSABS 2.2 02/16/2023 0923   EOSABS 0.2 02/16/2023 0923   BASOSABS 0.1 02/16/2023 0923     Parts of this note may have been dictated using voice recognition software. There may be variances in spelling and vocabulary which are unintentional. Not all errors are proofread. Please notify the dino if any discrepancies are noted or if the meaning of any statement is not clear.

## 2023-07-27 NOTE — Patient Instructions (Signed)

## 2023-07-28 LAB — LIPID PANEL
Cholesterol: 174 mg/dL (ref <200)
HDL: 44 mg/dL — ABNORMAL LOW (ref >=50)
LDL Cholesterol (Calc): 112 mg/dL — ABNORMAL HIGH
Non-HDL Cholesterol (Calc): 130 mg/dL — ABNORMAL HIGH (ref <130)
Total CHOL/HDL Ratio: 4 (calc) (ref <5.0)
Triglycerides: 81 mg/dL (ref <150)

## 2023-07-30 ENCOUNTER — Ambulatory Visit: Payer: Self-pay | Admitting: "Endocrinology

## 2023-07-30 ENCOUNTER — Encounter: Payer: Self-pay | Admitting: "Endocrinology

## 2023-07-31 ENCOUNTER — Other Ambulatory Visit: Payer: Self-pay

## 2023-07-31 DIAGNOSIS — E7800 Pure hypercholesterolemia, unspecified: Secondary | ICD-10-CM

## 2023-07-31 MED ORDER — ATORVASTATIN CALCIUM 40 MG PO TABS: 40.0000 mg | ORAL_TABLET | Freq: Every day | ORAL | 3 refills | Status: AC

## 2023-10-08 ENCOUNTER — Ambulatory Visit (HOSPITAL_BASED_OUTPATIENT_CLINIC_OR_DEPARTMENT_OTHER): Admitting: Family Medicine

## 2023-10-08 ENCOUNTER — Encounter (HOSPITAL_BASED_OUTPATIENT_CLINIC_OR_DEPARTMENT_OTHER): Payer: Self-pay | Admitting: Family Medicine

## 2023-10-08 VITALS — BP 125/82 | HR 91 | Ht 66.0 in | Wt 205.2 lb

## 2023-10-08 DIAGNOSIS — E119 Type 2 diabetes mellitus without complications: Secondary | ICD-10-CM

## 2023-10-08 DIAGNOSIS — E559 Vitamin D deficiency, unspecified: Secondary | ICD-10-CM

## 2023-10-08 DIAGNOSIS — Z1283 Encounter for screening for malignant neoplasm of skin: Secondary | ICD-10-CM

## 2023-10-08 DIAGNOSIS — Z7985 Long-term (current) use of injectable non-insulin antidiabetic drugs: Secondary | ICD-10-CM

## 2023-10-08 DIAGNOSIS — R059 Cough, unspecified: Secondary | ICD-10-CM | POA: Insufficient documentation

## 2023-10-08 DIAGNOSIS — R0602 Shortness of breath: Secondary | ICD-10-CM | POA: Insufficient documentation

## 2023-10-08 DIAGNOSIS — R051 Acute cough: Secondary | ICD-10-CM | POA: Diagnosis not present

## 2023-10-08 DIAGNOSIS — E611 Iron deficiency: Secondary | ICD-10-CM | POA: Insufficient documentation

## 2023-10-08 NOTE — Assessment & Plan Note (Signed)
 Found to be low in the past.  We had planned to recheck this, however she did not return to have labs completed.  She does indicate that she had labs recently through [facility] with provider outside of our system.  She was provided recommendations on supplementation - we will request records from provider to review

## 2023-10-08 NOTE — Progress Notes (Signed)
 Procedures performed today:    None.  Independent interpretation of notes and tests performed by another provider:   None.  Brief History, Exam, Impression, and Recommendations:    BP 125/82 (BP Location: Right Arm, Patient Position: Sitting, Cuff Size: Normal)   Pulse 91   Ht 5' 6 (1.676 m)   Wt 205 lb 3.2 oz (93.1 kg)   LMP 09/17/2023   SpO2 97%   BMI 33.12 kg/m   Discussed the use of AI scribe software for clinical note transcription with the patient, who gave verbal consent to proceed.  History of Present Illness Rebecca Hernandez is a 53 year old female with diabetes who presents with cough and shortness of breath.  She experiences coughing fits primarily at night and upon waking in the morning, accompanied by sneezing and a sensation of heaviness in her chest without tightness. She feels very short of breath, even after sitting for a short period.  She maintains a high level of physical activity, walking 10,000 to 15,000 steps daily, but reports significant fatigue over the past week, severe enough to prevent her from working for three days. Despite efforts with diet and exercise, she struggles with weight loss, losing only four to five pounds before regaining it.  Recent lab work done at the beginning of September indicated low iron and vitamin D  levels. She has started taking omega-3 supplements and a multivitamin. Her labs also showed a low ferritin level and an out-of-range white blood cell count. She has been managing her diabetes.  Over the past two weeks, she has experienced increased fatigue and breathlessness, with her voice becoming hoarser and deeper. A COVID test last week was negative. She has not experienced any stomach issues but had a decreased appetite during the three days she was unable to work.  On exam, patient is in no acute distress, vital signs stable.  Cardiovascular exam is regular rate and rhythm, lungs clear to auscultation  bilaterally.  Acute cough Assessment & Plan: Intermittent cough and shortness of breath, likely post-viral or sinus-related. Differential includes post-viral syndrome or sinus-related issues. Recent negative COVID test. - Request recent lab records from [facility] for review. - Consider nasal steroid spray at night for sinus-related symptoms. - Consider pulmonology referral if symptoms persist.   Shortness of breath  Vitamin D  deficiency Assessment & Plan: Found to be low in the past.  We had planned to recheck this, however she did not return to have labs completed.  She does indicate that she had labs recently through [facility] with provider outside of our system.  She was provided recommendations on supplementation - we will request records from provider to review   Skin cancer screening -     Ambulatory referral to Dermatology  Type 2 diabetes mellitus without complication, without long-term current use of insulin  [facility]) Assessment & Plan: Patient continues to follow-up with endocrinology.  Currently utilizing Trulicity , Jardiance , metformin .  Denies any issues with medication at this time.  She also has CGM device available.  Hemoglobin A1c has been gradually improving.  For now, can continue with current medication regimen. We did discuss lifestyle modifications, she has also been meeting with nutritionist. Referral to ophthalmology placed previously in order to complete retinopathy screening Plan to complete foot exam at next office visit   Iron deficiency Assessment & Plan: Patient reports that labs completed recently with [facility] provider showed that she had some iron deficiency, but not necessarily anemia.  She reports being recommended  medication/supplement to take. We will request records to review including both labs as well as notes from provider   Return in about 6 months (around 04/06/2024) for CPE with fasting labs 1 week  prior.   ___________________________________________ Rebecca Sitzer de Peru, MD, ABFM, CAQSM Primary Care and Sports Medicine [facility]

## 2023-10-08 NOTE — Patient Instructions (Signed)
  Medication Instructions:  Your physician recommends that you continue on your current medications as directed. Please refer to the Current Medication list given to you today. --If you need a refill on any your medications before your next appointment, please call your pharmacy first. If no refills are authorized on file call the office.-- Lab Work: Your physician has recommended that you have lab work today: 1 week before next visit  If you have labs (blood work) drawn today and your tests are completely normal, you will receive your results via MyChart message OR a phone call from our staff.  Please ensure you check your voicemail in the event that you authorized detailed messages to be left on a delegated number. If you have any lab test that is abnormal or we need to change your treatment, we will call you to review the results.  Follow-Up: Your next appointment:   Your physician recommends that you schedule a follow-up appointment in: 6 month physical with Dr. de Peru  You will receive a text message or e-mail with a link to a survey about your care and experience with [place] today! We would greatly appreciate your feedback!   Thanks for letting us be apart of your health journey!!  Primary Care and Sports Medicine   Dr. Ceasar Mons Peru   We encourage you to activate your patient portal called "MyChart".  Sign up information is provided on this After Visit Summary.  MyChart is used to connect with patients for Virtual Visits (Telemedicine).  Patients are able to view lab/test results, encounter notes, upcoming appointments, etc.  Non-urgent messages can be sent to your provider as well. To learn more about what you can do with MyChart, please visit --  [email or web address].

## 2023-10-08 NOTE — Assessment & Plan Note (Signed)
 Patient continues to follow-up with endocrinology.  Currently utilizing Trulicity , Jardiance , metformin .  Denies any issues with medication at this time.  She also has CGM device available.  Hemoglobin A1c has been gradually improving.  For now, can continue with current medication regimen. We did discuss lifestyle modifications, she has also been meeting with nutritionist. Referral to ophthalmology placed previously in order to complete retinopathy screening Plan to complete foot exam at next office visit

## 2023-10-08 NOTE — Assessment & Plan Note (Signed)
 Intermittent cough and shortness of breath, likely post-viral or sinus-related. Differential includes post-viral syndrome or sinus-related issues. Recent negative COVID test. - Request recent lab records from [facility] for review. - Consider nasal steroid spray at night for sinus-related symptoms. - Consider pulmonology referral if symptoms persist.

## 2023-10-08 NOTE — Assessment & Plan Note (Signed)
 Patient reports that labs completed recently with [facility] provider showed that she had some iron deficiency, but not necessarily anemia.  She reports being recommended medication/supplement to take. We will request records to review including both labs as well as notes from provider

## 2023-10-31 ENCOUNTER — Ambulatory Visit: Admitting: "Endocrinology

## 2023-11-08 ENCOUNTER — Encounter: Payer: Self-pay | Admitting: "Endocrinology

## 2023-11-08 ENCOUNTER — Ambulatory Visit: Admitting: "Endocrinology

## 2023-11-08 VITALS — BP 120/80 | HR 96 | Ht 66.0 in | Wt 213.0 lb

## 2023-11-08 DIAGNOSIS — E1165 Type 2 diabetes mellitus with hyperglycemia: Secondary | ICD-10-CM | POA: Diagnosis not present

## 2023-11-08 DIAGNOSIS — Z7985 Long-term (current) use of injectable non-insulin antidiabetic drugs: Secondary | ICD-10-CM | POA: Diagnosis not present

## 2023-11-08 DIAGNOSIS — E7800 Pure hypercholesterolemia, unspecified: Secondary | ICD-10-CM

## 2023-11-08 LAB — POCT GLYCOSYLATED HEMOGLOBIN (HGB A1C): Hemoglobin A1C: 7.7 % — AB (ref 4.0–5.6)

## 2023-11-08 MED ORDER — SYNJARDY 12.5-1000 MG PO TABS
1.0000 | ORAL_TABLET | Freq: Two times a day (BID) | ORAL | 2 refills | Status: AC
Start: 2023-11-08 — End: ?

## 2023-11-08 NOTE — Progress Notes (Signed)
 Outpatient Endocrinology Note Rebecca Birmingham, MD  11/08/23   Rebecca Hernandez [birth year] [id number]  Referring Provider: de [place], Raymond J, MD Primary Care Provider: de Peru, Raymond J, MD Reason for consultation: Subjective   Assessment & Plan  Diagnoses and all orders for this visit:  Uncontrolled type 2 diabetes mellitus with hyperglycemia (HCC) -     POCT glycosylated hemoglobin (Hb A1C)  Long-term (current) use of injectable non-insulin  antidiabetic drugs  Pure hypercholesterolemia  Other orders -     Empagliflozin -metFORMIN  HCl (SYNJARDY ) 12.05-998 MG TABS; Take 1 tablet by mouth 2 (two) times daily.   Diabetes Type II complicated by neuropathy  Lab Results  Component Value Date   GFR 103.82 07/12/2022   Hba1c goal less than 7, current Hba1c is 7.1% Lab Results  Component Value Date   HGBA1C 7.7 (A) 11/08/2023   Will recommend the following: Synjardy  12.5/1000mg  bid Trucility 4.5 mg weekly Not following Humalog  sliding scale 1:50>200, starting at 2 units  Correction scale: Use Humalog  insulin  based on blood sugars as follows: 201 - 250: 2 units 251 - 300: 3 units 301 - 350: 4 units 351 - 400: 5 units  Take Humalog  at least 4 hours apart  On DexCom   No known contraindications to any of above medications  12/27/2022 fasting LDL 103 Tg 85 Chol 180 GFR 109 AST 20 ALT 20  Lab Results  Component Value Date   LDLCALC 112 (H) 07/27/2023    Lab Results  Component Value Date   TRIG 81 07/27/2023   -stopped atorvastatin  10 mg every day and has made a decision not to take it  -Follow low fat diet and exercise   -Blood pressure goal <140/90 - Microalbumin/creatinine goal < 30 -Last MA/Cr is as follows: Lab Results  Component Value Date   MICROALBUR 2.9 07/23/2023   -not on ACE/ARB  -diet changes including salt restriction -limit eating outside -counseled BP targets per standards of diabetes care -uncontrolled blood pressure can  lead to retinopathy, nephropathy and cardiovascular and atherosclerotic heart disease  Reviewed and counseled on: -A1C target -Blood sugar targets -Complications of uncontrolled diabetes  -Checking blood sugar before meals and bedtime and bring log next visit -All medications with mechanism of action and side effects -Hypoglycemia management: rule of 15's, Glucagon Emergency Kit and medical alert ID -low-carb low-fat plate-method diet -At least 20 minutes of physical activity per day -Annual dilated retinal eye exam and foot exam -compliance and follow up needs -follow up as scheduled or earlier if problem gets worse  Call if blood sugar is less than 70 or consistently above 250    Take a 15 gm snack of carbohydrate at bedtime before you go to sleep if your blood sugar is less than 100.    If you are going to fast after midnight for a test or procedure, ask your physician for instructions on how to reduce/decrease your insulin  dose.    Call if blood sugar is less than 70 or consistently above 250  -Treating a low sugar by rule of 15  (15 gms of sugar every 15 min until sugar is more than 70) If you feel your sugar is low, test your sugar to be sure If your sugar is low (less than 70), then take 15 grams of a fast acting Carbohydrate (3-4 glucose tablets or glucose gel or 4 ounces of juice or regular soda) Recheck your sugar 15 min after treating low to make sure it is more than 70  If sugar is still less than 70, treat again with 15 grams of carbohydrate          Don't drive the hour of hypoglycemia  If unconscious/unable to eat or drink by mouth, use glucagon injection or nasal spray baqsimi and call 911. Can repeat again in 15 min if still unconscious.  Return in about 3 months (around 52/23/2026).   I have reviewed current medications, nurse's notes, allergies, vital signs, past medical and surgical history, family medical history, and social history for this encounter. Counseled  patient on symptoms, examination findings, lab findings, imaging results, treatment decisions and monitoring and prognosis. The patient understood the recommendations and agrees with the treatment plan. All questions regarding treatment plan were fully answered.  Rebecca Birmingham, MD  11/08/23   History of Present Illness Rebecca Hernandez is a 53 y.o. year old female who presents for follow up of Type II diabetes mellitus.  Rebecca Hernandez was first diagnosed in 2023.   Diabetes education +  Home diabetes regimen: Synjardy  12.5/1000mg  bid - last took about 2 week ago Trucility 4.5 mg weekly Not following Humalog  sliding scale 1:50>200, starting at 2 units  Correction scale: Use Humalog  insulin  based on blood sugars as follows: 201 - 250: 2 units 251 - 300: 3 units 301 - 350: 4 units 351 - 400: 5 units  Take Humalog  at least 4 hours apart  COMPLICATIONS -  MI/Stroke -  retinopathy + neuropathy -  nephropathy  BLOOD SUGAR DATA CGM interpretation: At today's visit, we reviewed her CGM downloads. The full report is scanned in the media. Reviewing the CGM trends, BG are mildly elevated almost across the day.  Physical Exam  BP 120/80   Pulse 96   Ht 5' 6 (1.676 m)   Wt 213 lb (96.6 kg)   SpO2 98%   BMI 34.38 kg/m    Constitutional: well developed, well nourished Head: normocephalic, atraumatic Eyes: sclera anicteric, no redness Neck: supple Lungs: normal respiratory effort Neurology: alert and oriented Skin: dry, no appreciable rashes Musculoskeletal: no appreciable defects Psychiatric: normal mood and affect Diabetic Foot Exam - Simple   Simple Foot Form Diabetic Foot exam was performed with the following findings: Yes 11/08/2023  9:01 AM  Visual Inspection No deformities, no ulcerations, no other skin breakdown bilaterally: Yes Sensation Testing Intact to touch and monofilament testing bilaterally: Yes Pulse Check Posterior Tibialis and Dorsalis pulse  intact bilaterally: Yes Comments      Current Medications Patient's Medications  New Prescriptions   No medications on file  Previous Medications   ATORVASTATIN  (LIPITOR) 40 MG TABLET    Take 1 tablet (40 mg total) by mouth daily.   CONTINUOUS GLUCOSE SENSOR (DEXCOM G7 SENSOR) MISC    Inject 1 Device into the skin every 10 days.   DULAGLUTIDE  (TRULICITY ) 4.5 MG/0.5ML SOAJ    Inject 4.5 mg as directed once a week.   INSULIN  LISPRO (HUMALOG  KWIKPEN) 100 UNIT/ML KWIKPEN    Based on sliding scale, max dose 25 units/day   INSULIN  PEN NEEDLE (PEN NEEDLES) 32G X 4 MM MISC    1 Device by Does not apply route in the morning, at noon, in the evening, and at bedtime.   VITAMIN D , ERGOCALCIFEROL , (DRISDOL ) 1.25 MG (50000 UNIT) CAPS CAPSULE    TAKE 1 CAPSULE (50,000 UNITS TOTAL) BY MOUTH EVERY 7 (SEVEN) DAYS. TAKE FOR 8 TOTAL DOSES(WEEKS)  Modified Medications   Modified Medication Previous Medication   EMPAGLIFLOZIN -METFORMIN  HCL (SYNJARDY ) 12.05-998 MG TABS Empagliflozin -metFORMIN   HCl (SYNJARDY ) 12.05-998 MG TABS      Take 1 tablet by mouth 2 (two) times daily.    Take 1 tablet by mouth 2 (two) times daily.  Discontinued Medications   No medications on file    Allergies Allergies  Allergen Reactions   Dilaudid [Hydromorphone Hcl] Shortness Of Breath    Disoriented. Burning sensation constant.    Past Medical History Past Medical History:  Diagnosis Date   Bell's palsy    Less than 64 years old    Diabetes (HCC)    Eczema    GERD (gastroesophageal reflux disease)    Left-sided face pain    Migraine headache     Past Surgical History Past Surgical History:  Procedure Laterality Date   BIOPSY N/A 03/27/2016   Procedure: BIOPSY small bowel;  Surgeon: Margo LITTIE Haddock, MD;  Location: [facility];  Service: Endoscopy;  Laterality: N/A;  small bowel biopsy   CHOLECYSTECTOMY N/A last year sept. 2015   COLONOSCOPY N/A 03/27/2016   Procedure: COLONOSCOPY;  Surgeon: Margo LITTIE Haddock, MD;   Location: [facility];  Service: Endoscopy;  Laterality: N/A;  8:30am   ESOPHAGOGASTRODUODENOSCOPY N/A 03/27/2016   Procedure: ESOPHAGOGASTRODUODENOSCOPY (EGD);  Surgeon: Margo LITTIE Haddock, MD;  Location: [facility];  Service: Endoscopy;  Laterality: N/A;   LAPAROSCOPIC ENDOMETRIOSIS FULGURATION      Family History family history includes Diabetes in her maternal uncle; Healthy in her father and mother; Heart attack in her maternal grandmother.  Social History Social History   Socioeconomic History   Marital status: Married    Spouse name: Not on file   Number of children: 0   Years of education: Not on file   Highest education level: Not on file  Occupational History   Occupation: Technical sales engineer of [employer]  Tobacco Use   Smoking status: Never    Passive exposure: Never   Smokeless tobacco: Never   Tobacco comments:    Never smoked  Vaping Use   Vaping status: Never Used  Substance and Sexual Activity   Alcohol use: Yes    Alcohol/week: 1.0 standard drink of alcohol    Types: 1 Glasses of wine per week    Comment: rare   Drug use: No   Sexual activity: Not on file  Other Topics Concern   Not on file  Social History Narrative   She is from [place]. Husband is in the [employer].    Right handed   Lives at home with husband    Caffeine~ 1 cup per day    Social Drivers of Health   Financial Resource Strain: Not on file  Food Insecurity: Not on file  Transportation Needs: Not on file  Physical Activity: Not on file  Stress: Not on file  Social Connections: Not on file  Intimate Partner Violence: Not on file    Lab Results  Component Value Date   HGBA1C 7.7 (A) 11/08/2023   HGBA1C 7.0 (A) 07/27/2023   HGBA1C 7.4 (H) 03/15/2023   Lab Results  Component Value Date   CHOL 174 07/27/2023   Lab Results  Component Value Date   HDL 44 (L) 07/27/2023   Lab Results  Component Value Date   LDLCALC 112 (H) 07/27/2023   Lab Results  Component Value Date   TRIG  81 07/27/2023   Lab Results  Component Value Date   CHOLHDL 4.0 07/27/2023   Lab Results  Component Value Date   CREATININE 0.68 03/15/2023  Lab Results  Component Value Date   GFR 103.82 07/12/2022   Lab Results  Component Value Date   MICROALBUR 2.9 07/23/2023      Component Value Date/Time   NA 138 03/15/2023 1053   NA 138 02/16/2023 0923   K 4.7 03/15/2023 1053   CL 106 03/15/2023 1053   CO2 27 03/15/2023 1053   GLUCOSE 118 (H) 03/15/2023 1053   BUN 14 03/15/2023 1053   BUN 11 02/16/2023 0923   CREATININE 0.68 03/15/2023 1053   CALCIUM  9.3 03/15/2023 1053   PROT 6.6 03/15/2023 1053   PROT 6.7 02/16/2023 0923   ALBUMIN 4.0 02/16/2023 0923   AST 18 03/15/2023 1053   ALT 22 03/15/2023 1053   ALKPHOS 95 02/16/2023 0923   BILITOT 0.6 03/15/2023 1053   BILITOT 0.4 02/16/2023 0923   GFRNONAA 80 09/17/2018 0943   GFRAA 92 09/17/2018 0943      Latest Ref Rng & Units 03/15/2023   10:53 AM 02/16/2023    9:23 AM 07/12/2022    9:10 AM  BMP  Glucose 65 - 99 mg/dL 881  826  712   BUN 7 - 25 mg/dL 14  11  13    Creatinine 0.50 - 1.03 mg/dL 9.31  9.28  9.38   BUN/Creat Ratio 6 - 22 (calc) SEE NOTE:  15    Sodium 135 - 146 mmol/L 138  138  133   Potassium 3.5 - 5.3 mmol/L 4.7  4.6  4.2   Chloride 98 - 110 mmol/L 106  104  102   CO2 20 - 32 mmol/L 27  22  23    Calcium  8.6 - 10.4 mg/dL 9.3  9.1  8.7        Component Value Date/Time   WBC 6.4 02/16/2023 0923   WBC 5.5 09/03/2013 1028   RBC 4.37 02/16/2023 0923   RBC 4.4 09/03/2013 1028   HGB 11.9 02/16/2023 0923   HCT 37.4 02/16/2023 0923   PLT 358 02/16/2023 0923   MCV 86 02/16/2023 0923   MCH 27.2 02/16/2023 0923   MCH 26.7 (A) 09/03/2013 1028   MCHC 31.8 02/16/2023 0923   MCHC 31.7 (A) 09/03/2013 1028   RDW 13.6 02/16/2023 0923   LYMPHSABS 2.2 02/16/2023 0923   EOSABS 0.2 02/16/2023 0923   BASOSABS 0.1 02/16/2023 0923     Parts of this note may have been dictated using voice recognition software. There may  be variances in spelling and vocabulary which are unintentional. Not all errors are proofread. Please notify the dino if any discrepancies are noted or if the meaning of any statement is not clear.

## 2023-11-08 NOTE — Patient Instructions (Signed)

## 2023-11-09 ENCOUNTER — Encounter: Payer: Self-pay | Admitting: Internal Medicine

## 2024-01-30 ENCOUNTER — Encounter (HOSPITAL_BASED_OUTPATIENT_CLINIC_OR_DEPARTMENT_OTHER): Payer: Self-pay | Admitting: Family Medicine

## 2024-01-30 NOTE — Telephone Encounter (Signed)
 Please see mychart message sent by pt and advise.

## 2024-02-08 ENCOUNTER — Ambulatory Visit: Admitting: "Endocrinology

## 2024-03-05 ENCOUNTER — Ambulatory Visit: Admitting: "Endocrinology

## 2024-03-31 ENCOUNTER — Ambulatory Visit (HOSPITAL_BASED_OUTPATIENT_CLINIC_OR_DEPARTMENT_OTHER)

## 2024-04-07 ENCOUNTER — Encounter (HOSPITAL_BASED_OUTPATIENT_CLINIC_OR_DEPARTMENT_OTHER): Admitting: Family Medicine
